# Patient Record
Sex: Female | Born: 1960 | Race: White | Hispanic: No | Marital: Married | Smoking: Never smoker
Health system: Southern US, Community
[De-identification: ages and names within clinical notes are randomized; demographics above are authoritative.]

## PROBLEM LIST (undated history)

## (undated) DIAGNOSIS — R002 Palpitations: Secondary | ICD-10-CM

## (undated) DIAGNOSIS — I341 Nonrheumatic mitral (valve) prolapse: Secondary | ICD-10-CM

## (undated) DIAGNOSIS — R0602 Shortness of breath: Secondary | ICD-10-CM

## (undated) DIAGNOSIS — E611 Iron deficiency: Secondary | ICD-10-CM

## (undated) DIAGNOSIS — D649 Anemia, unspecified: Secondary | ICD-10-CM

## (undated) HISTORY — DX: Palpitations: R00.2

## (undated) HISTORY — PX: ABDOMINAL HYSTERECTOMY: SHX81

## (undated) HISTORY — DX: Nonrheumatic mitral (valve) prolapse: I34.1

## (undated) HISTORY — PX: EXTERNAL EAR SURGERY: SHX627

---

## 1998-09-19 ENCOUNTER — Ambulatory Visit (HOSPITAL_COMMUNITY): Admission: RE | Admit: 1998-09-19 | Discharge: 1998-09-19 | Payer: Self-pay | Admitting: Obstetrics and Gynecology

## 2000-01-04 ENCOUNTER — Other Ambulatory Visit: Admission: RE | Admit: 2000-01-04 | Discharge: 2000-01-04 | Payer: Self-pay | Admitting: *Deleted

## 2002-02-08 ENCOUNTER — Other Ambulatory Visit: Admission: RE | Admit: 2002-02-08 | Discharge: 2002-02-08 | Payer: Self-pay | Admitting: Family Medicine

## 2004-10-06 ENCOUNTER — Other Ambulatory Visit: Admission: RE | Admit: 2004-10-06 | Discharge: 2004-10-06 | Payer: Self-pay | Admitting: Family Medicine

## 2006-01-17 ENCOUNTER — Other Ambulatory Visit: Admission: RE | Admit: 2006-01-17 | Discharge: 2006-01-17 | Payer: Self-pay | Admitting: Family Medicine

## 2007-03-27 ENCOUNTER — Other Ambulatory Visit: Admission: RE | Admit: 2007-03-27 | Discharge: 2007-03-27 | Payer: Self-pay | Admitting: Family Medicine

## 2008-01-12 ENCOUNTER — Encounter: Payer: Self-pay | Admitting: Cardiovascular Disease

## 2008-12-14 ENCOUNTER — Emergency Department (HOSPITAL_COMMUNITY): Admission: EM | Admit: 2008-12-14 | Discharge: 2008-12-14 | Payer: Self-pay | Admitting: Family Medicine

## 2009-02-24 ENCOUNTER — Other Ambulatory Visit: Admission: RE | Admit: 2009-02-24 | Discharge: 2009-02-24 | Payer: Self-pay | Admitting: Family Medicine

## 2010-08-28 ENCOUNTER — Encounter: Payer: Self-pay | Admitting: Cardiovascular Disease

## 2011-01-17 ENCOUNTER — Encounter: Payer: Self-pay | Admitting: Family Medicine

## 2011-08-16 ENCOUNTER — Other Ambulatory Visit: Payer: Self-pay | Admitting: Physician Assistant

## 2011-08-16 ENCOUNTER — Other Ambulatory Visit (HOSPITAL_COMMUNITY)
Admission: RE | Admit: 2011-08-16 | Discharge: 2011-08-16 | Disposition: A | Discharge status: Home / Self Care | Payer: 59 | Source: Ambulatory Visit | Attending: Family Medicine | Admitting: Family Medicine

## 2011-08-16 DIAGNOSIS — Z0000 Encounter for general adult medical examination without abnormal findings: Secondary | ICD-10-CM | POA: Insufficient documentation

## 2012-09-26 DIAGNOSIS — D649 Anemia, unspecified: Secondary | ICD-10-CM

## 2012-09-26 HISTORY — DX: Anemia, unspecified: D64.9

## 2012-10-30 ENCOUNTER — Other Ambulatory Visit: Payer: Self-pay | Admitting: Obstetrics and Gynecology

## 2012-11-13 NOTE — H&P (Signed)
FALON KENDAL  DICTATION # [id number] CSN# [id number]   Meriel Pica, MD 11/13/2012 10:47 AM

## 2012-11-14 ENCOUNTER — Inpatient Hospital Stay (HOSPITAL_COMMUNITY)
Admission: AD | Admit: 2012-11-14 | Discharge: 2012-11-15 | Disposition: A | Discharge status: Home / Self Care | Payer: 59 | Source: Ambulatory Visit | Attending: Obstetrics and Gynecology | Admitting: Obstetrics and Gynecology

## 2012-11-14 ENCOUNTER — Encounter (HOSPITAL_COMMUNITY): Payer: Self-pay | Admitting: Pharmacist

## 2012-11-14 ENCOUNTER — Other Ambulatory Visit (HOSPITAL_COMMUNITY): Payer: Self-pay | Admitting: Obstetrics and Gynecology

## 2012-11-14 ENCOUNTER — Encounter (HOSPITAL_COMMUNITY): Payer: Self-pay

## 2012-11-14 DIAGNOSIS — N949 Unspecified condition associated with female genital organs and menstrual cycle: Secondary | ICD-10-CM | POA: Insufficient documentation

## 2012-11-14 DIAGNOSIS — N938 Other specified abnormal uterine and vaginal bleeding: Secondary | ICD-10-CM | POA: Insufficient documentation

## 2012-11-14 NOTE — MAU Note (Signed)
PT SAYS SHE HAS BEEN BLEEDING SINCE September-   HAS BEEN DX BY DR HOLLAND - WITH  ADEMO..... UTERUS LINING IS THICK- DID U/S- TAKING BC PILLS- SINCE OCT- SLOWED DOWN BLEEDING SOME- DID BX-  IN NOV. CAME BACK BENIGN-    STILL BLEEDING.   LAST Friday- CRAMPING  COMES AND GOES-  BLEEDING HAS INCREASED.   WEARS TAMPON  AND PADS.    THEN TODAY -   AT 430PM- GOT H/A-  FELT  STRANGE- TOOK MOTRIN- WITH RELIEF.   AT  7PM-  FELT GUSH AND PASSED LARGE GOLF BALL SIZE CLOT.  CALLED DR AT 10PM.    NOW  IN RM 7  NOTHING ON PAD OR PERINEUM.    SCHEDULED FOR SURGERY ON Friday AM.-  D/C .

## 2012-11-14 NOTE — H&P (Signed)
NAMEKERSTON, [name] NO.:  [id number]  MEDICAL RECORD NO.:  [id number]  LOCATION:                                 FACILITY:  PHYSICIAN:  Duke Salvia. Marcelle Overlie, M.D.DATE OF BIRTH:  11-18-1961  DATE OF ADMISSION: DATE OF DISCHARGE:                             HISTORY & PHYSICAL   CHIEF COMPLAINT:  Menorrhagia with anemia.  HISTORY OF PRESENT ILLNESS:  [name], P3 whose husband has had a vasectomy.  I had seen her years ago and more recently, October of this year with complaints of heavy irregular periods.  Also, she has a separate complaint of urinary incontinence.  Evaluation in our office demonstrated normal thyroid profile, hemoglobin 8.7, FSH of 2. Sonohysterogram was carried out, showing some thickened myometrium suspicious for possible adenomyosis, simple cyst on the right with a possible 6-mm polypoid mass with irregular endometrial tissue noted. She had initially opted for a trial of OCPs, but continued to have significant menorrhagia presents now for D and C, hysteroscopy with NovaSure endometrial ablation.  This procedure including risks related to bleeding, infection or other complications that may require open additional surgery allowing her expected recovery time review with her, which she understands and accepts.  PAST MEDICAL HISTORY:  ALLERGIES:  PENICILLIN.  OBSTETRICAL HISTORY:  Three vaginal deliveries.  CURRENT MEDICATIONS:  OCPs b.i.d.  REVIEW OF SYSTEMS:  Otherwise negative.  FAMILY HISTORY:  Significant for heart disease, kidney disease, osteoporosis, arthritis, and diabetes.  SOCIAL HISTORY:  Denies drug or tobacco use, social alcohol use.  She is married.  Dr. Neva Seat is her PCP.  Pap dated October 06, 2012, was normal.  PHYSICAL EXAMINATION:  VITAL SIGNS:  Temperature 98.2, blood pressure 120/78. HEENT:  Unremarkable. NECK:  Supple without masses. LUNGS:  Clear. CARDIOVASCULAR:  Regular rate and rhythm  without murmurs, rubs, or gallops. BREASTS:  Without masses. ABDOMEN:  Soft, flat, nontender. PELVIC:  Normal external genitalia.  Vagina and cervix clear.  Uterus; upper limit and normal size, mobile.  Adnexa negative. EXTREMITIES:  Unremarkable. NEUROLOGIC:  Unremarkable.  IMPRESSION:  Menorrhagia with anemia.  PLAN:  D and C, hysteroscopy with NovaSure endometrial ablation. Procedure and risks discussed as above.     Oseas Detty M. Marcelle Overlie, M.D.     [facility]/MEDQ  D:  11/13/2012  T:  11/14/2012  Job:  [id number]

## 2012-11-15 LAB — CBC
HCT: 34.3 % — ABNORMAL LOW (ref 36.0–46.0)
Hemoglobin: 11.4 g/dL — ABNORMAL LOW (ref 12.0–15.0)
MCH: 28.9 pg (ref 26.0–34.0)
MCHC: 33.2 g/dL (ref 30.0–36.0)
MCV: 86.8 fL (ref 78.0–100.0)
RBC: 3.95 MIL/uL (ref 3.87–5.11)

## 2012-11-15 NOTE — MAU Provider Note (Signed)
History     CSN: [id number]  Arrival date and time: 11/14/12 2329   First Provider Initiated Contact with Patient 11/15/12 0026      No chief complaint on file.  HPI Kristen Lopez is a 51 y.o. female who presents to MAU with vaginal bleeding. She was evaluated in the office last week by Dr. Marcelle Overlie and is scheduled for a D&C and ablation later this week. She is taking 3 birth control pills a day for the bleeding. Today the bleeding increased and she is using a tampon and a pad every couple hours. She has passed clots tonight. She was concerned also due to a headache that started earlier today, but she took tylenol and it went away.  OB History    Grav Para Term Preterm Abortions TAB SAB Ect Mult Living   3 3 3       3       Past Medical History  Diagnosis Date  . MVP (mitral valve prolapse)   . Palpitations     Past Surgical History  Procedure Date  . External ear surgery     Family History  Problem Relation Age of Onset  . Other Father 37    Car accident  . Alcohol abuse Mother 79  . Diabetes Mother   . Hypertension Mother   . Arthritis Mother     History  Substance Use Topics  . Smoking status: Never Smoker   . Smokeless tobacco: Not on file  . Alcohol Use: 0.6 oz/week    1 Glasses of wine per week    Allergies:  Allergies  Allergen Reactions  . Erythromycin     Prescriptions prior to admission  Medication Sig Dispense Refill  . cyanocobalamin 2000 MCG tablet Take 2,000 mcg by mouth as needed.      . ferrous sulfate 325 (65 FE) MG tablet Take 325 mg by mouth daily with breakfast.      . Vitamin D, Ergocalciferol, (DRISDOL) 50000 UNITS CAPS Take 50,000 Units by mouth every 7 (seven) days.        Review of Systems  Constitutional: Negative for fever and chills.  Eyes: Negative for blurred vision and double vision.  Respiratory: Negative for cough and wheezing.   Cardiovascular: Negative for chest pain and palpitations.  Gastrointestinal: Negative  for nausea, vomiting and abdominal pain.  Genitourinary: Negative for frequency.       Vaginal bleeding  Skin: Negative for rash.  Neurological: Positive for headaches. Negative for dizziness.  Psychiatric/Behavioral: Negative for depression. The patient is not nervous/anxious.    Physical Exam   Blood pressure 159/93, pulse 71, temperature 98 F (36.7 C), temperature source Oral, resp. rate 20, last menstrual period 08/01/2012.  Physical Exam  Nursing note and vitals reviewed. Constitutional: She is oriented to person, place, and time. She appears well-developed and well-nourished. No distress.       Elevated BP  HENT:  Head: Normocephalic and atraumatic.  Eyes: EOM are normal.  Neck: Neck supple.  Cardiovascular: Normal rate.   Respiratory: Effort normal.  GI: Soft. There is no tenderness.  Genitourinary:       External genitalia without lesions. Moderate blood vaginal vault. No CMT, no adnexal tenderness. Uterus slightly enlarged.  Musculoskeletal: Normal range of motion.  Neurological: She is alert and oriented to person, place, and time.  Skin: Skin is warm and dry.  Psychiatric: She has a normal mood and affect. Her behavior is normal. Judgment and thought content normal.  Results for orders placed during the hospital encounter of 11/14/12 (from the past 24 hour(s))  CBC     Status: Abnormal   Collection Time   11/15/12 12:01 AM      Component Value Range   WBC 8.5  4.0 - 10.5 K/uL   RBC 3.95  3.87 - 5.11 MIL/uL   Hemoglobin 11.4 (*) 12.0 - 15.0 g/dL   HCT 16.1 (*) 09.6 - 04.5 %   MCV 86.8  78.0 - 100.0 fL   MCH 28.9  26.0 - 34.0 pg   MCHC 33.2  30.0 - 36.0 g/dL   RDW 40.9  81.1 - 91.4 %   Platelets 295  150 - 400 K/uL   MAU Course  Procedures Assessment: 51 y.o. female with dysfunctional bleeding  Plan:  Discussed with Dr. Rana Snare   Continue OC's   Call Dr. Marcelle Overlie later today for follow up, return here as needed.  Ivon Roedel, RN, FNP, [facility] 11/15/2012, 12:30  AM

## 2012-11-17 ENCOUNTER — Encounter (HOSPITAL_COMMUNITY): Payer: Self-pay | Admitting: Anesthesiology

## 2012-11-17 ENCOUNTER — Encounter (HOSPITAL_COMMUNITY): Payer: Self-pay | Admitting: *Deleted

## 2012-11-17 ENCOUNTER — Encounter (HOSPITAL_COMMUNITY)
Admission: RE | Disposition: A | Discharge status: Home / Self Care | Payer: Self-pay | Source: Ambulatory Visit | Attending: Obstetrics and Gynecology

## 2012-11-17 ENCOUNTER — Ambulatory Visit (HOSPITAL_COMMUNITY): Payer: 59 | Admitting: Anesthesiology

## 2012-11-17 ENCOUNTER — Ambulatory Visit (HOSPITAL_COMMUNITY)
Admission: RE | Admit: 2012-11-17 | Discharge: 2012-11-17 | Disposition: A | Discharge status: Home / Self Care | Payer: 59 | Source: Ambulatory Visit | Attending: Obstetrics and Gynecology | Admitting: Obstetrics and Gynecology

## 2012-11-17 DIAGNOSIS — N938 Other specified abnormal uterine and vaginal bleeding: Secondary | ICD-10-CM | POA: Insufficient documentation

## 2012-11-17 DIAGNOSIS — Z01812 Encounter for preprocedural laboratory examination: Secondary | ICD-10-CM | POA: Insufficient documentation

## 2012-11-17 DIAGNOSIS — Z01818 Encounter for other preprocedural examination: Secondary | ICD-10-CM | POA: Insufficient documentation

## 2012-11-17 DIAGNOSIS — N949 Unspecified condition associated with female genital organs and menstrual cycle: Secondary | ICD-10-CM | POA: Insufficient documentation

## 2012-11-17 DIAGNOSIS — D649 Anemia, unspecified: Secondary | ICD-10-CM | POA: Insufficient documentation

## 2012-11-17 HISTORY — PX: DILITATION & CURRETTAGE/HYSTROSCOPY WITH NOVASURE ABLATION: SHX5568

## 2012-11-17 HISTORY — PX: DILITATION & CURRETTAGE/HYSTROSCOPY WITH THERMACHOICE ABLATION: SHX5569

## 2012-11-17 LAB — CBC
HCT: 36 % (ref 36.0–46.0)
MCH: 28.4 pg (ref 26.0–34.0)
MCV: 88 fL (ref 78.0–100.0)
Platelets: 356 10*3/uL (ref 150–400)
RDW: 15.3 % (ref 11.5–15.5)
WBC: 10.2 10*3/uL (ref 4.0–10.5)

## 2012-11-17 SURGERY — DILATATION & CURETTAGE/HYSTEROSCOPY WITH NOVASURE ABLATION
Anesthesia: General | Site: Uterus | Wound class: Clean Contaminated

## 2012-11-17 MED ORDER — MIDAZOLAM HCL 5 MG/5ML IJ SOLN
INTRAMUSCULAR | Status: DC | PRN
  Administered 2012-11-17: 2 mg via INTRAVENOUS

## 2012-11-17 MED ORDER — ONDANSETRON HCL 4 MG/2ML IJ SOLN
INTRAMUSCULAR | Status: AC
  Filled 2012-11-17: qty 2

## 2012-11-17 MED ORDER — KETOROLAC TROMETHAMINE 30 MG/ML IJ SOLN: 15.0000 mg | Freq: Once | INTRAMUSCULAR | Status: DC | PRN

## 2012-11-17 MED ORDER — LIDOCAINE HCL 1 % IJ SOLN
INTRAMUSCULAR | Status: DC | PRN
  Administered 2012-11-17: 10 mL

## 2012-11-17 MED ORDER — DEXAMETHASONE SODIUM PHOSPHATE 4 MG/ML IJ SOLN
INTRAMUSCULAR | Status: DC | PRN
  Administered 2012-11-17: 10 mg via INTRAVENOUS

## 2012-11-17 MED ORDER — PROPOFOL 10 MG/ML IV EMUL
INTRAVENOUS | Status: DC | PRN
  Administered 2012-11-17: 200 mg via INTRAVENOUS

## 2012-11-17 MED ORDER — LACTATED RINGERS IV SOLN
INTRAVENOUS | Status: DC
  Administered 2012-11-17: 125 mL/h via INTRAVENOUS

## 2012-11-17 MED ORDER — LACTATED RINGERS IV SOLN
INTRAVENOUS | Status: DC | PRN
  Administered 2012-11-17: 1 via INTRAUTERINE

## 2012-11-17 MED ORDER — NORETHIN-ETH ESTRAD-FE BIPHAS 1 MG-10 MCG / 10 MCG PO TABS: 1.0000 | ORAL_TABLET | Freq: Every day | ORAL | Status: DC

## 2012-11-17 MED ORDER — LIDOCAINE HCL (CARDIAC) 20 MG/ML IV SOLN
INTRAVENOUS | Status: DC | PRN
  Administered 2012-11-17: 50 mg via INTRAVENOUS

## 2012-11-17 MED ORDER — MEPERIDINE HCL 25 MG/ML IJ SOLN: 6.2500 mg | INTRAMUSCULAR | Status: DC | PRN

## 2012-11-17 MED ORDER — FENTANYL CITRATE 0.05 MG/ML IJ SOLN
INTRAMUSCULAR | Status: DC | PRN
  Administered 2012-11-17 (×2): 50 ug via INTRAVENOUS

## 2012-11-17 MED ORDER — CEFAZOLIN SODIUM-DEXTROSE 2-3 GM-% IV SOLR
2.0000 g | INTRAVENOUS | Status: AC
  Administered 2012-11-17: 2 g via INTRAVENOUS

## 2012-11-17 MED ORDER — GLYCOPYRROLATE 0.2 MG/ML IJ SOLN
INTRAMUSCULAR | Status: DC | PRN
  Administered 2012-11-17: 0.2 mg via INTRAVENOUS

## 2012-11-17 MED ORDER — KETOROLAC TROMETHAMINE 30 MG/ML IJ SOLN
INTRAMUSCULAR | Status: DC | PRN
  Administered 2012-11-17: 30 mg via INTRAVENOUS

## 2012-11-17 MED ORDER — FENTANYL CITRATE 0.05 MG/ML IJ SOLN
INTRAMUSCULAR | Status: AC
  Filled 2012-11-17: qty 2

## 2012-11-17 MED ORDER — LIDOCAINE HCL (CARDIAC) 20 MG/ML IV SOLN
INTRAVENOUS | Status: AC
  Filled 2012-11-17: qty 5

## 2012-11-17 MED ORDER — CEFAZOLIN SODIUM-DEXTROSE 2-3 GM-% IV SOLR
INTRAVENOUS | Status: AC
  Filled 2012-11-17: qty 50

## 2012-11-17 MED ORDER — HYDROCODONE-IBUPROFEN 7.5-200 MG PO TABS: 1.0000 | ORAL_TABLET | Freq: Three times a day (TID) | ORAL | Status: DC | PRN

## 2012-11-17 MED ORDER — MIDAZOLAM HCL 2 MG/2ML IJ SOLN
INTRAMUSCULAR | Status: AC
  Filled 2012-11-17: qty 2

## 2012-11-17 MED ORDER — ONDANSETRON HCL 4 MG/2ML IJ SOLN: 4.0000 mg | Freq: Once | INTRAMUSCULAR | Status: DC | PRN

## 2012-11-17 MED ORDER — PROPOFOL 10 MG/ML IV EMUL
INTRAVENOUS | Status: AC
  Filled 2012-11-17: qty 20

## 2012-11-17 MED ORDER — ONDANSETRON HCL 4 MG/2ML IJ SOLN
INTRAMUSCULAR | Status: DC | PRN
  Administered 2012-11-17: 4 mg via INTRAVENOUS

## 2012-11-17 MED ORDER — GLYCOPYRROLATE 0.2 MG/ML IJ SOLN
INTRAMUSCULAR | Status: AC
  Filled 2012-11-17: qty 1

## 2012-11-17 MED ORDER — KETOROLAC TROMETHAMINE 30 MG/ML IJ SOLN
INTRAMUSCULAR | Status: AC
  Filled 2012-11-17: qty 1

## 2012-11-17 MED ORDER — FENTANYL CITRATE 0.05 MG/ML IJ SOLN: 25.0000 ug | INTRAMUSCULAR | Status: DC | PRN

## 2012-11-17 SURGICAL SUPPLY — 11 items
ABLATOR ENDOMETRIAL BIPOLAR (ABLATOR) ×3 IMPLANT
CATH THERMACHOICE III (CATHETERS) ×1 IMPLANT
CLOTH BEACON ORANGE TIMEOUT ST (SAFETY) ×2 IMPLANT
CONTAINER PREFILL 10% NBF 60ML (FORM) ×4 IMPLANT
DRESSING TELFA 8X3 (GAUZE/BANDAGES/DRESSINGS) ×2 IMPLANT
GLOVE BIO SURGEON STRL SZ7 (GLOVE) ×4 IMPLANT
GOWN STRL REIN XL XLG (GOWN DISPOSABLE) ×6 IMPLANT
PACK HYSTEROSCOPY LF (CUSTOM PROCEDURE TRAY) ×2 IMPLANT
PAD OB MATERNITY 4.3X12.25 (PERSONAL CARE ITEMS) ×2 IMPLANT
TOWEL OR 17X24 6PK STRL BLUE (TOWEL DISPOSABLE) ×4 IMPLANT
WATER STERILE IRR 1000ML POUR (IV SOLUTION) ×2 IMPLANT

## 2012-11-17 NOTE — Op Note (Signed)
Preoperative diagnosis: Probable adenomyosis, abnormal uterine bleeding with anemia  Postoperative diagnosis: Same  Procedure: Hysteroscopy with D&C, failed NovaSure, failed ThermaChoice endometrial ablation  Surgeon: Marcelle Overlie  Anesthesia: Gen.  EBL: Less than 50 cc  Specimens removed: Endometrial curettings, to pathology  Procedure and findings:  The patient taken the operating room after an adequate level of general anesthesia was obtained the vagina was prepped and prepped and draped in usual fashion for D&C the bladder was drained he UA carried out uterus was 10 week size mobile adnexa negative. Speculum was positioned cervix was grasped grasped with a tenaculum the cervix was already adequately dilated to a 32 Pratt at least from the amount of bleeding that she's had, dilatation was not required. Paracervical block was created by infiltrating at 3 and 9:00 submucosally 5-7 cc 1% Xylocaine at each site after negative aspiration the uterus is then sounded to 9 cm, the scope was inserted a lot of shaggy endometrium and some old blood clot was noted, sharp curettage was carried out specimen was sent to pathology. The NovaSure device was inserted with a width of 4.5 the cavity depth 6.0, initially we could not get CO2 testing to pass, it was thought to perhaps be a generator problem, second generator was positioned still could not get the CO2 testing the past. That point I cannot discern any trouble leaking from the cervix so we tried a second NovaSure device at this time even with Bilton seal there was some bubbling of CO2 from her over dilated cervix, posterior tenaculum was positioned along with Vaseline gauze did not get the CO2 testing in the past I think the tube a leakage at the over dilated cervical os. This point decision made to switch to ThermaChoice EMA. During the change, the uterus was rescan her to 9 cm, no evidence of any perforation. ThermaChoice balloon was then inserted to the normal  depth, however we could not maintain pressure above 150 to allow the procedure to take place. Attempts at ablation were aborted at that point, and stress removed. She went to recovery room in good condition.  Dictated with [facility]  Ebon Ketchum M. Milana Obey.D.

## 2012-11-17 NOTE — Anesthesia Postprocedure Evaluation (Signed)
  Anesthesia Post-op Note  Patient: Kristen Lopez  Procedure(s) Performed: Procedure(s) (LRB) with comments: DILATATION & CURETTAGE/HYSTEROSCOPY WITH NOVASURE ABLATION (N/A) - Attempted and failed DILATATION & CURETTAGE/HYSTEROSCOPY WITH THERMACHOICE ABLATION (N/A)  Patient Location: PACU  Anesthesia Type:General  Level of Consciousness: awake, alert  and oriented  Airway and Oxygen Therapy: Patient Spontanous Breathing  Post-op Pain: none  Post-op Assessment: Post-op Vital signs reviewed, Patient's Cardiovascular Status Stable, Respiratory Function Stable, Patent Airway, No signs of Nausea or vomiting and Pain level controlled  Post-op Vital Signs: Reviewed and stable  Complications: No apparent anesthesia complications

## 2012-11-17 NOTE — Anesthesia Preprocedure Evaluation (Signed)
Anesthesia Evaluation  Patient identified by MRN, date of birth, ID band Patient awake    Reviewed: Allergy & Precautions, H&P , NPO status , Patient's Chart, lab work & pertinent test results  Airway Mallampati: I TM Distance: >3 FB Neck ROM: full    Dental No notable dental hx. (+) Teeth Intact   Pulmonary neg pulmonary ROS,    Pulmonary exam normal       Cardiovascular negative cardio ROS      Neuro/Psych negative neurological ROS  negative psych ROS   GI/Hepatic negative GI ROS, Neg liver ROS,   Endo/Other  negative endocrine ROS  Renal/GU negative Renal ROS  negative genitourinary   Musculoskeletal negative musculoskeletal ROS (+)   Abdominal Normal abdominal exam  (+)   Peds negative pediatric ROS (+)  Hematology negative hematology ROS (+)   Anesthesia Other Findings   Reproductive/Obstetrics negative OB ROS                           Anesthesia Physical Anesthesia Plan  ASA: II  Anesthesia Plan: General   Post-op Pain Management:    Induction: Intravenous  Airway Management Planned: LMA  Additional Equipment:   Intra-op Plan:   Post-operative Plan:   Informed Consent: I have reviewed the patients History and Physical, chart, labs and discussed the procedure including the risks, benefits and alternatives for the proposed anesthesia with the patient or authorized representative who has indicated his/her understanding and acceptance.     Plan Discussed with: CRNA and Surgeon  Anesthesia Plan Comments:         Anesthesia Quick Evaluation  

## 2012-11-17 NOTE — Progress Notes (Signed)
The patient was re-examined with no change in status 

## 2012-11-17 NOTE — Transfer of Care (Signed)
Immediate Anesthesia Transfer of Care Note  Patient: Kristen Lopez  Procedure(s) Performed: Procedure(s) (LRB) with comments: DILATATION & CURETTAGE/HYSTEROSCOPY WITH NOVASURE ABLATION (N/A) - Attempted and failed DILATATION & CURETTAGE/HYSTEROSCOPY WITH THERMACHOICE ABLATION (N/A)  Patient Location: PACU  Anesthesia Type:General  Level of Consciousness: awake, alert  and oriented  Airway & Oxygen Therapy: Patient Spontanous Breathing and Patient connected to nasal cannula oxygen  Post-op Assessment: Report given to PACU RN and Post -op Vital signs reviewed and stable  Post vital signs: Reviewed and stable  Complications: No apparent anesthesia complications

## 2012-11-20 ENCOUNTER — Encounter (HOSPITAL_COMMUNITY): Payer: Self-pay | Admitting: Obstetrics and Gynecology

## 2012-11-20 NOTE — H&P (Signed)
CAMIRA SHIRAR  DICTATION # [id number] CSN# [id number]   Meriel Pica, MD 11/20/2012 9:38 AM

## 2012-11-21 ENCOUNTER — Encounter (HOSPITAL_COMMUNITY): Payer: Self-pay

## 2012-11-21 ENCOUNTER — Encounter (HOSPITAL_COMMUNITY)
Admission: RE | Admit: 2012-11-21 | Discharge: 2012-11-21 | Disposition: A | Discharge status: Home / Self Care | Payer: 59 | Source: Ambulatory Visit | Attending: Obstetrics and Gynecology | Admitting: Obstetrics and Gynecology

## 2012-11-21 HISTORY — DX: Shortness of breath: R06.02

## 2012-11-21 HISTORY — DX: Anemia, unspecified: D64.9

## 2012-11-21 LAB — CBC
HCT: 36.5 % (ref 36.0–46.0)
Hemoglobin: 12.1 g/dL (ref 12.0–15.0)
RBC: 4.15 MIL/uL (ref 3.87–5.11)

## 2012-11-21 LAB — SURGICAL PCR SCREEN
MRSA, PCR: NEGATIVE
Staphylococcus aureus: NEGATIVE

## 2012-11-21 NOTE — Patient Instructions (Addendum)
Your procedure is scheduled on:11/22/12  Enter through the Main Entrance at :6am Pick up desk phone and dial [phone] and inform us of your arrival.  Please call [phone] if you have any problems the morning of surgery.  Remember: Do not eat or drink after midnight:tonight   Take these meds the morning of surgery with a sip of water:none DO NOT wear jewelry, eye make-up, lipstick,body lotion, or dark fingernail polish. Do not shave today.  If you are to be admitted after surgery, leave suitcase in car until your room has been assigned.

## 2012-11-21 NOTE — H&P (Signed)
Kristen Lopez, Kristen NO.:  Lopez  MEDICAL RECORD NO.:  [id number]  LOCATION:                                 FACILITY:  PHYSICIAN:  Duke Salvia. Marcelle Overlie, M.D.DATE OF BIRTH:  04/14/1961  DATE OF ADMISSION: DATE OF DISCHARGE:                             HISTORY & PHYSICAL   CHIEF COMPLAINT:  Menorrhagia.  HISTORY OF PRESENT ILLNESS:  A 51 year old, G3, P3, whose husband has had a vasectomy.  She presented in the last 6 months with complaints of anemia with menorrhagia.  Endometrial biopsy was performed in the office on November 08, 2012, that showed atrophic-appearing endometrium.  No evidence of hyperplasia.  FHT was performed on November 08, 2012, that showed thickened myometrium 4.5 and 2.3 cm consistent with adenomyosis. Adnexa unremarkable.  A polypoid mass noted on saline infusion.  She was treated with OCPs either b.i.d. or t.i.d. to regulate her bleeding, and was started on iron and presented for outpatient D and C, hysteroscopy with ablation.  At the time of ablation, the cavity was too large for the NovaSure to work, when we tried the ThermaChoice, we could never achieved consistent pressure with insufflation of the balloon.  This was a failed endometrial ablation.  She presents now for LAVH.  This procedure including risks related to bleeding, infection, transfusion adjacent to organ injury, the possible need to complete the surgery by open technique along with her expected recovery time reviewed, which she understands and accepts.  Additionally, recent [facility] was 2.0, vitamin D was normal and thyroid profile was normal.  PAST MEDICAL HISTORY:  ALLERGIES:  PENICILLIN, which causes nausea.  She has had three vaginal deliveries in the past, currently on OCPs and iron.  REVIEW OF SYSTEMS:  Otherwise negative.  FAMILY HISTORY:  Significant for history of heart disease, kidney disease, osteoporosis, arthritis, and diabetes.  SOCIAL HISTORY:   Denies tobacco or drug use.  She does drink socially. She is married.  Dr. Lucrezia Europe is her PCP.  PHYSICAL EXAMINATION:  VITAL SIGNS:  Temp 98.2, blood pressure 120/78. HEENT:  Unremarkable. NECK:  Supple without masses. LUNGS:  Clear. CARDIOVASCULAR:  Regular rate and rhythm without murmurs, rubs, or gallops. BREASTS:  Without masses. ABDOMEN:  Soft, flat, nontender. PELVIC:  Normal external genitalia.  Vagina and cervix are clear. Uterus is 9-10 weeks size, mid-position, mobile.  Adnexa negative. EXTREMITIES:  Unremarkable. NEUROLOGIC:  Unremarkable.  IMPRESSION: 1. Menorrhagia with anemia. 2. Adenomyosis.  PLAN:  LAVH.  Procedure and risks discussed as above.     Ezell Poke M. Marcelle Overlie, M.D.     [facility]/MEDQ  D:  11/20/2012  T:  11/20/2012  Job:  [id number]

## 2012-11-22 ENCOUNTER — Encounter (HOSPITAL_COMMUNITY): Payer: Self-pay | Admitting: Anesthesiology

## 2012-11-22 ENCOUNTER — Observation Stay (HOSPITAL_COMMUNITY)
Admission: RE | Admit: 2012-11-22 | Discharge: 2012-11-23 | Disposition: A | Discharge status: Home / Self Care | Payer: 59 | Source: Ambulatory Visit | Attending: Obstetrics and Gynecology | Admitting: Obstetrics and Gynecology

## 2012-11-22 ENCOUNTER — Encounter (HOSPITAL_COMMUNITY)
Admission: RE | Disposition: A | Discharge status: Home / Self Care | Payer: Self-pay | Source: Ambulatory Visit | Attending: Obstetrics and Gynecology

## 2012-11-22 ENCOUNTER — Encounter (HOSPITAL_COMMUNITY): Payer: Self-pay

## 2012-11-22 ENCOUNTER — Ambulatory Visit (HOSPITAL_COMMUNITY): Payer: 59 | Admitting: Anesthesiology

## 2012-11-22 DIAGNOSIS — D649 Anemia, unspecified: Secondary | ICD-10-CM | POA: Insufficient documentation

## 2012-11-22 DIAGNOSIS — N920 Excessive and frequent menstruation with regular cycle: Principal | ICD-10-CM | POA: Insufficient documentation

## 2012-11-22 DIAGNOSIS — N8000 Endometriosis of the uterus, unspecified: Secondary | ICD-10-CM | POA: Insufficient documentation

## 2012-11-22 DIAGNOSIS — N841 Polyp of cervix uteri: Secondary | ICD-10-CM | POA: Insufficient documentation

## 2012-11-22 HISTORY — PX: LAPAROSCOPIC ASSISTED VAGINAL HYSTERECTOMY: SHX5398

## 2012-11-22 SURGERY — HYSTERECTOMY, VAGINAL, LAPAROSCOPY-ASSISTED
Anesthesia: General | Site: Abdomen | Wound class: Clean Contaminated

## 2012-11-22 MED ORDER — FENTANYL CITRATE 0.05 MG/ML IJ SOLN
INTRAMUSCULAR | Status: DC | PRN
  Administered 2012-11-22: 150 ug via INTRAVENOUS
  Administered 2012-11-22: 100 ug via INTRAVENOUS

## 2012-11-22 MED ORDER — GLYCOPYRROLATE 0.2 MG/ML IJ SOLN
INTRAMUSCULAR | Status: DC | PRN
  Administered 2012-11-22: 0.4 mg via INTRAVENOUS

## 2012-11-22 MED ORDER — DIPHENHYDRAMINE HCL 50 MG/ML IJ SOLN: 12.5000 mg | Freq: Four times a day (QID) | INTRAMUSCULAR | Status: DC | PRN

## 2012-11-22 MED ORDER — MIDAZOLAM HCL 5 MG/5ML IJ SOLN
INTRAMUSCULAR | Status: DC | PRN
  Administered 2012-11-22: 2 mg via INTRAVENOUS

## 2012-11-22 MED ORDER — SCOPOLAMINE 1 MG/3DAYS TD PT72
MEDICATED_PATCH | TRANSDERMAL | Status: AC
  Filled 2012-11-22: qty 1

## 2012-11-22 MED ORDER — GENTAMICIN SULFATE 40 MG/ML IJ SOLN
INTRAMUSCULAR | Status: AC
  Administered 2012-11-22: 100 mL via INTRAVENOUS
  Filled 2012-11-22: qty 7.69

## 2012-11-22 MED ORDER — LIDOCAINE HCL (CARDIAC) 20 MG/ML IV SOLN
INTRAVENOUS | Status: AC
  Filled 2012-11-22: qty 5

## 2012-11-22 MED ORDER — GLYCOPYRROLATE 0.2 MG/ML IJ SOLN
INTRAMUSCULAR | Status: AC
  Filled 2012-11-22: qty 2

## 2012-11-22 MED ORDER — DEXAMETHASONE SODIUM PHOSPHATE 10 MG/ML IJ SOLN
INTRAMUSCULAR | Status: AC
  Filled 2012-11-22: qty 1

## 2012-11-22 MED ORDER — SCOPOLAMINE 1 MG/3DAYS TD PT72
1.0000 | MEDICATED_PATCH | TRANSDERMAL | Status: DC
  Administered 2012-11-22: 1.5 mg via TRANSDERMAL

## 2012-11-22 MED ORDER — ONDANSETRON HCL 4 MG/2ML IJ SOLN
INTRAMUSCULAR | Status: AC
  Filled 2012-11-22: qty 2

## 2012-11-22 MED ORDER — FENTANYL CITRATE 0.05 MG/ML IJ SOLN
INTRAMUSCULAR | Status: AC
  Filled 2012-11-22: qty 5

## 2012-11-22 MED ORDER — MORPHINE SULFATE (PF) 1 MG/ML IV SOLN
INTRAVENOUS | Status: DC
  Administered 2012-11-22: 13 mg via INTRAVENOUS
  Administered 2012-11-22: 10:00:00 via INTRAVENOUS
  Administered 2012-11-22: 4 mg via INTRAVENOUS
  Administered 2012-11-22: 9 mg via INTRAVENOUS
  Administered 2012-11-23: 1 mg via INTRAVENOUS
  Filled 2012-11-22: qty 25

## 2012-11-22 MED ORDER — NEOSTIGMINE METHYLSULFATE 1 MG/ML IJ SOLN
INTRAMUSCULAR | Status: AC
  Filled 2012-11-22: qty 10

## 2012-11-22 MED ORDER — ONDANSETRON HCL 4 MG/2ML IJ SOLN
INTRAMUSCULAR | Status: DC | PRN
  Administered 2012-11-22: 4 mg via INTRAVENOUS

## 2012-11-22 MED ORDER — ROCURONIUM BROMIDE 100 MG/10ML IV SOLN
INTRAVENOUS | Status: DC | PRN
  Administered 2012-11-22: 40 mg via INTRAVENOUS

## 2012-11-22 MED ORDER — ONDANSETRON HCL 4 MG PO TABS: 4.0000 mg | ORAL_TABLET | Freq: Four times a day (QID) | ORAL | Status: DC | PRN

## 2012-11-22 MED ORDER — LIDOCAINE HCL (CARDIAC) 20 MG/ML IV SOLN
INTRAVENOUS | Status: DC | PRN
  Administered 2012-11-22: 50 mg via INTRAVENOUS

## 2012-11-22 MED ORDER — IBUPROFEN 800 MG PO TABS: 800.0000 mg | ORAL_TABLET | Freq: Three times a day (TID) | ORAL | Status: DC | PRN

## 2012-11-22 MED ORDER — FENTANYL CITRATE 0.05 MG/ML IJ SOLN
INTRAMUSCULAR | Status: AC
  Administered 2012-11-22: 50 ug via INTRAVENOUS
  Filled 2012-11-22: qty 2

## 2012-11-22 MED ORDER — HYDROMORPHONE HCL PF 1 MG/ML IJ SOLN
INTRAMUSCULAR | Status: DC | PRN
  Administered 2012-11-22: 1 mg via INTRAVENOUS

## 2012-11-22 MED ORDER — LACTATED RINGERS IV SOLN
INTRAVENOUS | Status: DC
  Administered 2012-11-22 (×2): via INTRAVENOUS

## 2012-11-22 MED ORDER — BUPIVACAINE HCL (PF) 0.25 % IJ SOLN
INTRAMUSCULAR | Status: DC | PRN
  Administered 2012-11-22: 6 mL

## 2012-11-22 MED ORDER — SODIUM CHLORIDE 0.9 % IJ SOLN: 9.0000 mL | INTRAMUSCULAR | Status: DC | PRN

## 2012-11-22 MED ORDER — NALOXONE HCL 0.4 MG/ML IJ SOLN: 0.4000 mg | INTRAMUSCULAR | Status: DC | PRN

## 2012-11-22 MED ORDER — ONDANSETRON HCL 4 MG/2ML IJ SOLN: 4.0000 mg | Freq: Four times a day (QID) | INTRAMUSCULAR | Status: DC | PRN

## 2012-11-22 MED ORDER — MENTHOL 3 MG MT LOZG
1.0000 | LOZENGE | OROMUCOSAL | Status: DC | PRN
  Administered 2012-11-22: 3 mg via ORAL
  Filled 2012-11-22: qty 9

## 2012-11-22 MED ORDER — OXYCODONE-ACETAMINOPHEN 5-325 MG PO TABS: 1.0000 | ORAL_TABLET | ORAL | Status: DC | PRN

## 2012-11-22 MED ORDER — NEOSTIGMINE METHYLSULFATE 1 MG/ML IJ SOLN
INTRAMUSCULAR | Status: DC | PRN
  Administered 2012-11-22: 2 mg via INTRAVENOUS

## 2012-11-22 MED ORDER — PROMETHAZINE HCL 25 MG/ML IJ SOLN: 6.2500 mg | INTRAMUSCULAR | Status: DC | PRN

## 2012-11-22 MED ORDER — MIDAZOLAM HCL 2 MG/2ML IJ SOLN: 0.5000 mg | Freq: Once | INTRAMUSCULAR | Status: DC | PRN

## 2012-11-22 MED ORDER — ROCURONIUM BROMIDE 50 MG/5ML IV SOLN
INTRAVENOUS | Status: AC
  Filled 2012-11-22: qty 1

## 2012-11-22 MED ORDER — KETOROLAC TROMETHAMINE 30 MG/ML IJ SOLN: 30.0000 mg | Freq: Once | INTRAMUSCULAR | Status: DC

## 2012-11-22 MED ORDER — DEXTROSE IN LACTATED RINGERS 5 % IV SOLN
INTRAVENOUS | Status: DC
  Administered 2012-11-22 – 2012-11-23 (×2): via INTRAVENOUS

## 2012-11-22 MED ORDER — KETOROLAC TROMETHAMINE 30 MG/ML IJ SOLN: 15.0000 mg | Freq: Once | INTRAMUSCULAR | Status: DC | PRN

## 2012-11-22 MED ORDER — MIDAZOLAM HCL 2 MG/2ML IJ SOLN
INTRAMUSCULAR | Status: AC
  Filled 2012-11-22: qty 2

## 2012-11-22 MED ORDER — LACTATED RINGERS IR SOLN
Status: DC | PRN
  Administered 2012-11-22: 3000 mL

## 2012-11-22 MED ORDER — KETOROLAC TROMETHAMINE 30 MG/ML IJ SOLN
INTRAMUSCULAR | Status: AC
  Filled 2012-11-22: qty 1

## 2012-11-22 MED ORDER — SODIUM CHLORIDE 0.9 % IJ SOLN
INTRAMUSCULAR | Status: DC | PRN
  Administered 2012-11-22: 10 mL

## 2012-11-22 MED ORDER — FENTANYL CITRATE 0.05 MG/ML IJ SOLN
25.0000 ug | INTRAMUSCULAR | Status: DC | PRN
  Administered 2012-11-22 (×2): 50 ug via INTRAVENOUS

## 2012-11-22 MED ORDER — ZOLPIDEM TARTRATE 5 MG PO TABS: 5.0000 mg | ORAL_TABLET | Freq: Every evening | ORAL | Status: DC | PRN

## 2012-11-22 MED ORDER — HYDROMORPHONE HCL PF 1 MG/ML IJ SOLN
INTRAMUSCULAR | Status: AC
  Filled 2012-11-22: qty 1

## 2012-11-22 MED ORDER — KETOROLAC TROMETHAMINE 30 MG/ML IJ SOLN
INTRAMUSCULAR | Status: DC | PRN
  Administered 2012-11-22: 30 mg via INTRAVENOUS

## 2012-11-22 MED ORDER — PROPOFOL 10 MG/ML IV EMUL
INTRAVENOUS | Status: DC | PRN
  Administered 2012-11-22: 200 mg via INTRAVENOUS

## 2012-11-22 MED ORDER — DIPHENHYDRAMINE HCL 12.5 MG/5ML PO ELIX: 12.5000 mg | ORAL_SOLUTION | Freq: Four times a day (QID) | ORAL | Status: DC | PRN

## 2012-11-22 MED ORDER — BUTORPHANOL TARTRATE 1 MG/ML IJ SOLN: 1.0000 mg | INTRAMUSCULAR | Status: DC | PRN

## 2012-11-22 MED ORDER — KETOROLAC TROMETHAMINE 30 MG/ML IJ SOLN
30.0000 mg | Freq: Four times a day (QID) | INTRAMUSCULAR | Status: DC
  Administered 2012-11-22 – 2012-11-23 (×3): 30 mg via INTRAVENOUS
  Filled 2012-11-22 (×2): qty 1

## 2012-11-22 MED ORDER — BUPIVACAINE HCL (PF) 0.25 % IJ SOLN
INTRAMUSCULAR | Status: AC
  Filled 2012-11-22: qty 30

## 2012-11-22 MED ORDER — MEPERIDINE HCL 25 MG/ML IJ SOLN: 6.2500 mg | INTRAMUSCULAR | Status: DC | PRN

## 2012-11-22 MED ORDER — PROPOFOL 10 MG/ML IV EMUL
INTRAVENOUS | Status: AC
  Filled 2012-11-22: qty 20

## 2012-11-22 MED ORDER — KETOROLAC TROMETHAMINE 30 MG/ML IJ SOLN
30.0000 mg | Freq: Four times a day (QID) | INTRAMUSCULAR | Status: DC
  Filled 2012-11-22: qty 1

## 2012-11-22 MED ORDER — DEXAMETHASONE SODIUM PHOSPHATE 4 MG/ML IJ SOLN
INTRAMUSCULAR | Status: DC | PRN
  Administered 2012-11-22: 10 mg via INTRAVENOUS

## 2012-11-22 SURGICAL SUPPLY — 32 items
ADH SKN CLS APL DERMABOND .7 (GAUZE/BANDAGES/DRESSINGS) ×1
CATH ROBINSON RED A/P 16FR (CATHETERS) ×1 IMPLANT
CLOTH BEACON ORANGE TIMEOUT ST (SAFETY) ×2 IMPLANT
CONT PATH 16OZ SNAP LID 3702 (MISCELLANEOUS) ×2 IMPLANT
COVER TABLE BACK 60X90 (DRAPES) ×2 IMPLANT
DERMABOND ADVANCED (GAUZE/BANDAGES/DRESSINGS) ×1
DERMABOND ADVANCED .7 DNX12 (GAUZE/BANDAGES/DRESSINGS) ×2 IMPLANT
ELECT LIGASURE LONG (ELECTRODE) ×2 IMPLANT
ELECT REM PT RETURN 9FT ADLT (ELECTROSURGICAL) ×2
ELECTRODE REM PT RTRN 9FT ADLT (ELECTROSURGICAL) ×1 IMPLANT
GLOVE BIO SURGEON STRL SZ7 (GLOVE) ×6 IMPLANT
GLOVE BIOGEL PI IND STRL 6.5 (GLOVE) ×1 IMPLANT
GLOVE BIOGEL PI INDICATOR 6.5 (GLOVE) ×1
GOWN STRL REIN XL XLG (GOWN DISPOSABLE) ×8 IMPLANT
NDL INSUFFLATION 14GA 120MM (NEEDLE) ×1 IMPLANT
NEEDLE INSUFFLATION 14GA 120MM (NEEDLE) ×2 IMPLANT
NS IRRIG 1000ML POUR BTL (IV SOLUTION) ×2 IMPLANT
PACK LAVH (CUSTOM PROCEDURE TRAY) ×2 IMPLANT
PROTECTOR NERVE ULNAR (MISCELLANEOUS) ×2 IMPLANT
SEALER TISSUE G2 CVD JAW 45CM (ENDOMECHANICALS) ×2 IMPLANT
SET IRRIG TUBING LAPAROSCOPIC (IRRIGATION / IRRIGATOR) ×1 IMPLANT
SUT MON AB 2-0 CT1 36 (SUTURE) ×4 IMPLANT
SUT VIC AB 0 CT1 18XCR BRD8 (SUTURE) ×3 IMPLANT
SUT VIC AB 0 CT1 8-18 (SUTURE) ×6
SUT VICRYL 0 TIES 12 18 (SUTURE) ×2 IMPLANT
SUT VICRYL 4-0 PS2 18IN ABS (SUTURE) ×2 IMPLANT
TOWEL OR 17X24 6PK STRL BLUE (TOWEL DISPOSABLE) ×4 IMPLANT
TRAY FOLEY CATH 14FR (SET/KITS/TRAYS/PACK) ×2 IMPLANT
TROCAR Z-THREAD BLADED 11X100M (TROCAR) ×2 IMPLANT
TROCAR Z-THREAD BLADED 5X100MM (TROCAR) ×3 IMPLANT
WARMER LAPAROSCOPE (MISCELLANEOUS) ×2 IMPLANT
WATER STERILE IRR 1000ML POUR (IV SOLUTION) ×2 IMPLANT

## 2012-11-22 NOTE — Anesthesia Postprocedure Evaluation (Signed)
  Anesthesia Post-op Note  Patient: Kristen Lopez  Procedure(s) Performed: Procedure(s) (LRB) with comments: LAPAROSCOPIC ASSISTED VAGINAL HYSTERECTOMY (N/A)  Patient Location: Women[facility] Unit  Anesthesia Type:General  Level of Consciousness: awake, alert  and oriented  Airway and Oxygen Therapy: Patient Spontanous Breathing and Patient connected to nasal cannula oxygen  Post-op Pain: none  Post-op Assessment: Post-op Vital signs reviewed and Patient's Cardiovascular Status Stable  Post-op Vital Signs: Reviewed and stable  Complications: No apparent anesthesia complications

## 2012-11-22 NOTE — Progress Notes (Signed)
The patient was re-examined with no change in status 

## 2012-11-22 NOTE — Transfer of Care (Signed)
Immediate Anesthesia Transfer of Care Note  Patient: Kristen Lopez  Procedure(s) Performed: Procedure(s) (LRB) with comments: LAPAROSCOPIC ASSISTED VAGINAL HYSTERECTOMY (N/A)  Patient Location: PACU  Anesthesia Type:General  Level of Consciousness: awake, alert  and oriented  Airway & Oxygen Therapy: Patient Spontanous Breathing and Patient connected to nasal cannula oxygen  Post-op Assessment: Report given to PACU RN and Post -op Vital signs reviewed and stable  Post vital signs: Reviewed and stable  Complications: No apparent anesthesia complications

## 2012-11-22 NOTE — Anesthesia Preprocedure Evaluation (Signed)
Anesthesia Evaluation  Patient identified by MRN, date of birth, ID band Patient awake    Reviewed: Allergy & Precautions, H&P , Patient's Chart, lab work & pertinent test results, reviewed documented beta blocker date and time   History of Anesthesia Complications Negative for: history of anesthetic complications  Airway Mallampati: II TM Distance: >3 FB Neck ROM: full    Dental No notable dental hx.    Pulmonary neg pulmonary ROS, shortness of breath,  breath sounds clear to auscultation  Pulmonary exam normal       Cardiovascular Exercise Tolerance: Good negative cardio ROS  + Valvular Problems/Murmurs MVP Rhythm:regular Rate:Normal     Neuro/Psych negative neurological ROS  negative psych ROS   GI/Hepatic negative GI ROS, Neg liver ROS,   Endo/Other  negative endocrine ROS  Renal/GU negative Renal ROS     Musculoskeletal   Abdominal   Peds  Hematology negative hematology ROS (+)   Anesthesia Other Findings MVP (mitral valve prolapse)     Palpitations   1980    Anemia 09/2012   Shortness of breath   with anemia       Reproductive/Obstetrics negative OB ROS                           Anesthesia Physical Anesthesia Plan  ASA: II  Anesthesia Plan: General ETT   Post-op Pain Management:    Induction:   Airway Management Planned:   Additional Equipment:   Intra-op Plan:   Post-operative Plan:   Informed Consent: I have reviewed the patients History and Physical, chart, labs and discussed the procedure including the risks, benefits and alternatives for the proposed anesthesia with the patient or authorized representative who has indicated his/her understanding and acceptance.   Dental Advisory Given  Plan Discussed with: CRNA and Surgeon  Anesthesia Plan Comments:         Anesthesia Quick Evaluation

## 2012-11-22 NOTE — Anesthesia Postprocedure Evaluation (Signed)
Anesthesia Post Note  Patient: Kristen Lopez  Procedure(s) Performed: Procedure(s) (LRB): LAPAROSCOPIC ASSISTED VAGINAL HYSTERECTOMY (N/A)  Anesthesia type: General  Patient location: PACU  Post pain: Pain level controlled  Post assessment: Post-op Vital signs reviewed  Last Vitals:  Filed Vitals:   11/22/12 0915  BP: 136/86  Pulse: 66  Temp:   Resp: 15    Post vital signs: Reviewed  Level of consciousness: sedated  Complications: No apparent anesthesia complicationsfj

## 2012-11-22 NOTE — Addendum Note (Signed)
Addendum  created 11/22/12 1759 by Shanon Payor, CRNA   Modules edited:Notes Section

## 2012-11-22 NOTE — Op Note (Signed)
Preoperative diagnosis: Menorrhagia with anemia, adenomyosis  Postoperative diagnosis: Same  Procedure: LAVH  Surgeon: Marcelle Overlie  Assistant: Renaldo Fiddler  EBL: 200 cc  Specimens removed: Uterus, to pathology  Complications: None  Procedure and findings:  The patient taken the operating room after an adequate level of general anesthesia, appropriate timeout for taken. The abdomen perineum and vagina were prepped and draped in usual fashion for vaginal procedures. The bladder was drained he UA carried out uterus was 9 weeks size mobile adnexa negative. Hulka tenaculum was positioned. Attention directed to the abdomen the subumbilical area was infiltrated with quarter percent Marcaine plain small incision was made in the varies needle was introduced without difficulty. Its intra-abdominal position was verified by pressure water testing. After 2-1/2 L pneumoperitoneum syncopated lap scopic trocar and sleeve were then introduced that difficulty. The patient in Trendelenburg, 3 finger breaths above the symphysis in the midline a 5 mm trocar was inserted under direct visualization. The pelvic findings as follows  Uterus itself was symmetrically enlarged, globular at the fundus consistent with adenomyosis 9-10 week size. Adnexa unremarkable. Starting on the right the utero-ovarian pedicle was coagulated and divided with the in seal device down to and including the round ligament with excellent hemostasis. The exact same was repeated on the opposite side, thus conserving both ovaries. After this was completed the vaginal portion the procedure was started  The legs were extended weighted speculum was positioned cervix grasped with tenaculum the cervical vaginal mucosa was incised posterior culdotomy performed without difficulty. Bladder was advanced superiorly with sharp and blunt dissection until the a tear peritoneal reflection could be identified, this was entered sharply and a retractor was then used to  gently elevate the bladder out of the field. Using the LigaSure device the uterosacral ligament, cardinal ligament uterine vasculature pedicles and upper broad ligament pedicles were coagulated and divided staying close to the uterus. These areas were hemostatic cannot deliver the fundus at that point it to its size, the lower uterus is then morcellated until the fundus could be delivered remaining pedicles were clamped divided the remainder of the uterus was removed sent to pathology these pedicles were free tie with 0 Vicryl ties. Vaginal cuff was then run from 3 to 9:00 with a running locked 2-0 Vicryl suture. Prior to closure sponge denies precast reported as correct x2 vaginal mucosa was then closed right to left with interrupted 2-0 Monocryl sutures this area was hemostatic the bladder was drained the Foley clear urine was then noted. Repeat laparoscopy was carried out within a shot, excellent hemostasis, the pressure was reduced, the operative site was hemostatic no other abnormalities noted at final laparoscopy. Its was removed the subumbilical defect closed with a 4-0 Monocryl subcuticular with Dermabond on the lower incision she tolerated this well went to recovery in good condition.  Dictated with [facility]  Cedra Villalon M. Milana Obey.D.

## 2012-11-23 ENCOUNTER — Encounter (HOSPITAL_COMMUNITY): Payer: Self-pay | Admitting: *Deleted

## 2012-11-23 LAB — CBC
HCT: 30.4 % — ABNORMAL LOW (ref 36.0–46.0)
Hemoglobin: 9.9 g/dL — ABNORMAL LOW (ref 12.0–15.0)
MCHC: 32.6 g/dL (ref 30.0–36.0)
RDW: 15.5 % (ref 11.5–15.5)
WBC: 14.3 10*3/uL — ABNORMAL HIGH (ref 4.0–10.5)

## 2012-11-23 MED ORDER — IBUPROFEN 800 MG PO TABS: 800.0000 mg | ORAL_TABLET | Freq: Three times a day (TID) | ORAL | Status: DC | PRN

## 2012-11-23 NOTE — Progress Notes (Signed)
1 Day Post-Op Procedure(s) (LRB): LAPAROSCOPIC ASSISTED VAGINAL HYSTERECTOMY (N/A)  Subjective: Patient reports tolerating PO.    Objective: I have reviewed patient's vital signs and labs. BP 136/77  Pulse 63  Temp 97.9 F (36.6 C) (Oral)  Resp 18  Ht 5\' 4"  (1.626 m)  Wt 158 lb (71.668 kg)  BMI 27.12 kg/m2  SpO2 97%  LMP 08/01/2012 abd soft + BS, Incs c/d  Assessment: s/p Procedure(s) (LRB) with comments: LAPAROSCOPIC ASSISTED VAGINAL HYSTERECTOMY (N/A): stable  Plan: Discharge home  LOS: 1 day    Kenrick Pore M 11/23/2012, 10:30 AM

## 2012-11-23 NOTE — Discharge Summary (Signed)
Physician Discharge Summary  Patient ID: Kristen Lopez MRN: [id number] DOB/AGE: 06-14-1961 51 y.o.  Admit date: 11/22/2012 Discharge date: 11/23/2012  Admission Diagnoses:  Discharge Diagnoses:  Active Problems:  * No active hospital problems. *    Discharged Condition: good  Hospital Course: adm for LAVH, with 250 cc EBL, DC on POD #1 afeb, abd benign , tol PO  Consults: None  Significant Diagnostic Studies: labs:  CBC    Component Value Date/Time   WBC 14.3* 11/23/2012 0530   RBC 3.42* 11/23/2012 0530   HGB 9.9* 11/23/2012 0530   HCT 30.4* 11/23/2012 0530   PLT 293 11/23/2012 0530   MCV 88.9 11/23/2012 0530   MCH 28.9 11/23/2012 0530   MCHC 32.6 11/23/2012 0530   RDW 15.5 11/23/2012 0530      Treatments: surgery: LAVH  Discharge Exam: Blood pressure 136/77, pulse 63, temperature 97.9 F (36.6 C), temperature source Oral, resp. rate 18, height 5\' 4"  (1.626 m), weight 158 lb (71.668 kg), last menstrual period 08/01/2012, SpO2 97.00%. abd soft + BS, Incs C/D  Disposition: 01-Home or Self Care     Medication List     As of 11/23/2012 10:34 AM    STOP taking these medications         Norethindrone-Ethinyl Estradiol-Fe Biphas 1 MG-10 MCG / 10 MCG tablet   Commonly known as: LO LOESTRIN FE      OVER THE COUNTER MEDICATION      vitamin C 500 MG tablet   Commonly known as: ASCORBIC ACID      TAKE these medications         ferrous sulfate 325 (65 FE) MG tablet   Take 650 mg by mouth daily with breakfast.      ibuprofen 800 MG tablet   Commonly known as: ADVIL,MOTRIN   Take 1 tablet (800 mg total) by mouth every 8 (eight) hours as needed (mild pain).           Follow-up Information    Schedule an appointment as soon as possible for a visit with Meriel Pica, MD. (office will call)    Contact information:   [address] [place] [address] [phone]          Signed: Meriel Pica 11/23/2012, 10:34  AM

## 2012-11-23 NOTE — Progress Notes (Signed)
Pt.. Is discharged in the care of husband. Denies any heavy vaginal bleeding,pain nausea vomiting.. Lapsites are clean and dry. Discharge instruction were given with good understanding. Questions were asked and answered. Stable.

## 2013-01-23 ENCOUNTER — Other Ambulatory Visit: Payer: Self-pay | Admitting: Dermatology

## 2013-06-29 ENCOUNTER — Emergency Department (HOSPITAL_COMMUNITY)
Admission: EM | Admit: 2013-06-29 | Discharge: 2013-06-29 | Disposition: A | Discharge status: Home / Self Care | Payer: 59 | Source: Home / Self Care | Attending: Emergency Medicine | Admitting: Emergency Medicine

## 2013-06-29 ENCOUNTER — Encounter (HOSPITAL_COMMUNITY): Payer: Self-pay | Admitting: Emergency Medicine

## 2013-06-29 DIAGNOSIS — H8110 Benign paroxysmal vertigo, unspecified ear: Secondary | ICD-10-CM

## 2013-06-29 HISTORY — DX: Iron deficiency: E61.1

## 2013-06-29 LAB — POCT I-STAT, CHEM 8
Chloride: 104 mEq/L (ref 96–112)
Creatinine, Ser: 0.8 mg/dL (ref 0.50–1.10)
Glucose, Bld: 90 mg/dL (ref 70–99)
HCT: 48 % — ABNORMAL HIGH (ref 36.0–46.0)
Hemoglobin: 16.3 g/dL — ABNORMAL HIGH (ref 12.0–15.0)
Potassium: 4.1 mEq/L (ref 3.5–5.1)
Sodium: 142 mEq/L (ref 135–145)

## 2013-06-29 MED ORDER — MECLIZINE HCL 25 MG PO TABS: 25.0000 mg | ORAL_TABLET | Freq: Four times a day (QID) | ORAL | Status: DC

## 2013-06-29 NOTE — ED Provider Notes (Signed)
Chief Complaint:   Chief Complaint  Patient presents with  . Dizziness    History of Present Illness:   Kristen Lopez is a 52 year old female who has a long and complicated medical history. She has had a one-year history of pain in her neck, shoulders, and back. She has been seeing a chiropractor for this for about the past year. About 2-3 weeks ago he started her on some supplements because of the pain. Both supplements had multiple ingredients including vitamins and various herbal remedies. After she started the supplements she felt lightheaded and he told her to cut back but not stop completely. He did some lab tests which she was told showed iron deficiency and evidence of some type of infection or inflammation. In reviewing her lab results, all of her labs appear to be in the normal range including her hemoglobin, hematocrit, ferritin, iron, iron-binding capacity. Her chiropractor told her that the normal ranges which physicians use are not very good and that he recommends better levels for optimum health. She had a normal white blood cell count, but she did have somewhat of a left shift. She denies any fever or chills. For the past week she's had episodes of near-syncope, vertigo, and felt lightheaded. The symptoms are worse in the morning and better during the day. They get worse after she takes a nap. They're often brought on by position changes. She denies any ear pain or difficulty hearing, ringing in ears, or focal neurological symptoms. She's also had symptoms of heart racing, anxiety, difficulty focusing, has felt confused, and felt tired and rundown and is lacking energy.  Review of Systems:  Other than noted above, the patient denies any of the following symptoms. Systemic:  No fever, chills, sweats, fatigue, myalgias, headache, or anorexia. Eye:  No redness, pain or drainage. ENT:  No earache, nasal congestion, rhinorrhea, sinus pressure, or sore throat. Lungs:  No cough, sputum  production, wheezing, shortness of breath.  Cardiovascular:  No chest pain, palpitations, or syncope. GI:  No nausea, vomiting, abdominal pain or diarrhea. GU:  No dysuria, frequency, or hematuria. Skin:  No rash or pruritis.  PMFSH:  Past medical history, family history, social history, meds, and allergies were reviewed.   Physical Exam:   Vital signs:  BP 118/90  Pulse 90  Temp(Src) 98.3 F (36.8 C) (Oral)  Resp 16  SpO2 100%  LMP 08/01/2012 Filed Vitals:   06/29/13 1415 Sitting  06/29/13 1520 Supine  06/29/13 1521  Standing   BP: 127/81 128/88 118/90  Pulse: 80 82 90  Temp: 98.3 F (36.8 C)    TempSrc: Oral    Resp: 16    SpO2: 100%     General:  Alert, in no distress. Eye:  PERRL, full EOMs.  Lids and conjunctivas were normal. ENT:  TMs and canals were normal, without erythema or inflammation.  Nasal mucosa was clear and uncongested, without drainage.  Mucous membranes were moist.  Pharynx was clear, without exudate or drainage.  There were no oral ulcerations or lesions. Neck:  Supple, no adenopathy, tenderness or mass. Thyroid was normal. Lungs:  No respiratory distress.  Lungs were clear to auscultation, without wheezes, rales or rhonchi.  Breath sounds were clear and equal bilaterally. Heart:  Regular rhythm, without gallops, murmers or rubs. Abdomen:  Soft, flat, and non-tender to palpation.  No hepatosplenomagaly or mass. Extremities: No edema, pulses were full. Neurological exam: Alert and oriented x3, speech was clear. Muscle strength and cranial nerves were intact.  Hallpike Dix maneuver was positive with the right ear down. Skin:  Clear, warm, and dry, without rash or lesions.  Labs:   Results for orders placed during the hospital encounter of 06/29/13  POCT I-STAT, CHEM 8      Result Value Range   Sodium 142  135 - 145 mEq/L   Potassium 4.1  3.5 - 5.1 mEq/L   Chloride 104  96 - 112 mEq/L   BUN 7  6 - 23 mg/dL   Creatinine, Ser 1.61  0.50 - 1.10 mg/dL    Glucose, Bld 90  70 - 99 mg/dL   Calcium, Ion 0.96  1.12 - 1.23 mmol/L   TCO2 26  0 - 100 mmol/L   Hemoglobin 16.3 (*) 12.0 - 15.0 g/dL   HCT 04.5 (*) 40.9 - 81.1 %    EKG Results:  Date: 06/29/2013  Rate: 67  Rhythm: normal sinus rhythm  QRS Axis: normal  Intervals: normal  ST/T Wave abnormalities: normal  Conduction Disutrbances:none  Narrative Interpretation: Normal sinus rhythm, normal EKG.  Old EKG Reviewed: none available  Assessment:  The encounter diagnosis was Benign positional vertigo.  She seems to have many concerns about her health, and unfortunately has bought in to a chiropractor's ideas about health and supplements. It appears that her main acute problem is that of benign positional vertigo. I explained to her the pathogenesis of this and the treatment which would include Epley exercises and Antivert as needed. I suggested if no better in a week that she see an ENT specialist. I doubt that we will find a cause for her confusion and difficulty focusing. I suggested that she may want to see a neurologist about this for further evaluation. She seems satisfied with this advice.  Plan:   1.  The following meds were prescribed:   Discharge Medication List as of 06/29/2013  3:39 PM    START taking these medications   Details  meclizine (ANTIVERT) 25 MG tablet Take 1 tablet (25 mg total) by mouth 4 (four) times daily., Starting 06/29/2013, Until Discontinued, Normal       2.  The patient was instructed in symptomatic care and handouts were given. 3.  The patient was told to return if becoming worse in any way, if no better in 3 or 4 days, and given some red flag symptoms such as any new or changing neurological symptoms that would indicate earlier return. 4.  Follow up with Dr. Darnell Level for the vertigo if no better in a week and with Dr. Porfirio Mylar Dohmeier for the confusion and difficulty focusing.       Reuben Likes, MD 06/29/13 [phone]

## 2013-06-29 NOTE — ED Notes (Addendum)
Patient gives extensive and detailed history of "natural supplements" and visits with chiropractors and "holistic"approaches to her health concerns.  C/o light headedness that was noticed Sunday.  Reports episodes of lightheaded ness every morning after waking, including late afternoon nap that was followed by lightheadedness.  Patient related these episodes to the most recent supplement additions.  Stopped taking clearvite psf and adrenacalm.  Last night felt anxious and heart racing.  Denies pain.  Patient originally seeking medical attention for back, neck, and shoulder pain when the physician started patient on supplements

## 2013-07-10 ENCOUNTER — Telehealth: Payer: Self-pay

## 2013-07-10 NOTE — Telephone Encounter (Signed)
PT called and stated that she knows Dr. Clent Ridges through church, and that she would like to be seen asap as a new patient. She then requested that Dr. Clent Ridges give her a call. May I schedule this? Thank you!

## 2013-07-13 NOTE — Telephone Encounter (Signed)
Sorry but I am not accepting new patients but I recommend Padonda or Dr. Selena Batten for her

## 2013-07-13 NOTE — Telephone Encounter (Signed)
Notified pt on personally identified voicemail.

## 2013-07-24 DIAGNOSIS — R209 Unspecified disturbances of skin sensation: Secondary | ICD-10-CM | POA: Insufficient documentation

## 2013-07-24 DIAGNOSIS — R42 Dizziness and giddiness: Secondary | ICD-10-CM | POA: Insufficient documentation

## 2013-07-24 DIAGNOSIS — Z862 Personal history of diseases of the blood and blood-forming organs and certain disorders involving the immune mechanism: Secondary | ICD-10-CM | POA: Insufficient documentation

## 2013-07-24 DIAGNOSIS — M542 Cervicalgia: Secondary | ICD-10-CM | POA: Insufficient documentation

## 2013-07-30 ENCOUNTER — Encounter: Payer: Self-pay | Admitting: Neurology

## 2013-07-30 ENCOUNTER — Ambulatory Visit (INDEPENDENT_AMBULATORY_CARE_PROVIDER_SITE_OTHER): Payer: 59 | Admitting: Neurology

## 2013-07-30 VITALS — BP 125/81 | HR 70 | Ht 64.5 in | Wt 150.0 lb

## 2013-07-30 DIAGNOSIS — R42 Dizziness and giddiness: Secondary | ICD-10-CM

## 2013-07-30 DIAGNOSIS — Z862 Personal history of diseases of the blood and blood-forming organs and certain disorders involving the immune mechanism: Secondary | ICD-10-CM

## 2013-07-30 DIAGNOSIS — R209 Unspecified disturbances of skin sensation: Secondary | ICD-10-CM

## 2013-07-30 DIAGNOSIS — M542 Cervicalgia: Secondary | ICD-10-CM

## 2013-07-30 NOTE — Progress Notes (Signed)
Reason for visit: Dizziness  Kristen Lopez is a 52 y.o. female  History of present illness:  Kristen Lopez is a 52 year old right-handed white female with a history of onset of problems with dizziness and lightheaded sensations in June of 2014. The patient indicates that she has had episodes of the sensation of near-syncope, and she also has episodes of lightheaded floaty feelings, sometimes associated with a rocking sensations. The patient indicates that there is no particular head movement that brings on the dizziness, and the dizziness may occur while she is up and moving, or while sitting. The patient does have some intermittent neck and shoulder discomfort, and some migratory low back discomfort. The patient does not relate the dizziness sensations to increases in neck or shoulder discomfort. The patient has been seen by a chiropractor, without significant benefit. The patient reports a throbbing sensation in the feet, and some sensation of fatigue. The patient has noted that at times, the dizziness and the throbbing sensations in the feet, together. The feet may be involved together, or separately. The patient denies any significant balance issues or problems controlling the bowels. The patient does have some occasional stress incontinence of the bladder. The patient denies any pain radiating down the arms. The patient denies any changes in hearing, ringing in the ears, or ear pain. The patient indicates that meclizine may help her dizziness when the dizziness is present. The episodes of dizziness may last 2 or 3 hours. The patient reports no history of headache. The patient denies double vision or loss of vision. There is no history of slurred speech or problems with swallowing.  Past Medical History  Diagnosis Date  . MVP (mitral valve prolapse)   . Palpitations     1980  . Anemia 09/2012  . Shortness of breath     with anemia  . Low iron     Past Surgical History  Procedure  Laterality Date  . External ear surgery    . Dilitation & currettage/hystroscopy with novasure ablation  11/17/2012    Procedure: DILATATION & CURETTAGE/HYSTEROSCOPY WITH NOVASURE ABLATION;  Surgeon: Meriel Pica, MD;  Location: [facility];  Service: Gynecology;  Laterality: N/A;  Attempted and failed  . Dilitation & currettage/hystroscopy with thermachoice ablation  11/17/2012    Procedure: DILATATION & CURETTAGE/HYSTEROSCOPY WITH THERMACHOICE ABLATION;  Surgeon: Meriel Pica, MD;  Location: [facility];  Service: Gynecology;  Laterality: N/A;  . Laparoscopic assisted vaginal hysterectomy  11/22/2012    Procedure: LAPAROSCOPIC ASSISTED VAGINAL HYSTERECTOMY;  Surgeon: Meriel Pica, MD;  Location: [facility];  Service: Gynecology;  Laterality: N/A;  . Abdominal hysterectomy      Family History  Problem Relation Age of Onset  . Other Father 10    Car accident  . Alcohol abuse Mother 17  . Diabetes Mother   . Hypertension Mother   . Arthritis Mother   . Heart failure Maternal Grandmother   . Hypertension Paternal Grandmother   . ALS Paternal Grandfather     Social history:  reports that she has never smoked. She has never used smokeless tobacco. She reports that she drinks about 0.6 ounces of alcohol per week. She reports that she does not use illicit drugs.  Medications:  Current Outpatient Prescriptions on File Prior to Visit  Medication Sig Dispense Refill  . b complex vitamins tablet Take 2 tablets by mouth daily.       . Cholecalciferol (BIO-D-MULSION FORTE PO) Take by mouth.      Marland Kitchen  ibuprofen (ADVIL,MOTRIN) 800 MG tablet Take 1 tablet (800 mg total) by mouth every 8 (eight) hours as needed (mild pain).  30 tablet  2  . meclizine (ANTIVERT) 25 MG tablet Take 1 tablet (25 mg total) by mouth 4 (four) times daily.  28 tablet  0  . OVER THE COUNTER MEDICATION B.HCL, Thymex, Ferro Food, Thytrophin PMG, [place]       No current facility-administered medications on file prior to  visit.    Allergies:  Allergies  Allergen Reactions  . Erythromycin Other (See Comments)    No rash, "just makes me feel bad"    ROS:  Out of a complete 14 system review of symptoms, the patient complains only of the following symptoms, and all other reviewed systems are negative.  Fatigue Slight hearing loss Dizziness Blurred vision Feeling hot, increased thirst Achy muscles  Blood pressure 125/81, pulse 70, height 5' 4.5" (1.638 m), weight 150 lb (68.04 kg), last menstrual period 08/01/2012.  Physical Exam  General: The patient is alert and cooperative at the time of the examination.  Head: Pupils are equal, round, and reactive to light. Discs are flat bilaterally.  Neck: The neck is supple, no carotid bruits are noted.  Respiratory: The respiratory examination is clear.  Cardiovascular: The cardiovascular examination reveals a regular rate and rhythm, no obvious murmurs or rubs are noted.  Neuromuscular: Range of movement of the cervical spine is full. No crepitus is noted in the temporomandibular joints.  Skin: Extremities are without significant edema.  Neurologic Exam  Mental status:  Cranial nerves: Facial symmetry is present. There is good sensation of the face to pinprick and soft touch bilaterally. The strength of the facial muscles and the muscles to head turning and shoulder shrug are normal bilaterally. Speech is well enunciated, no aphasia or dysarthria is noted. Extraocular movements are full. Visual fields are full.  Motor: The motor testing reveals 5 over 5 strength of all 4 extremities. Good symmetric motor tone is noted throughout.  Sensory: Sensory testing is intact to pinprick, soft touch, vibration sensation, and position sense on all 4 extremities, with the exception that position sense is decreased on the right foot. No evidence of extinction is noted.  Coordination: Cerebellar testing reveals good finger-nose-finger and heel-to-shin  bilaterally. The Nyan-Barrany procedure was negative.  Gait and station: Gait is normal. Tandem gait is normal. Romberg is negative. No drift is seen.  Reflexes: Deep tendon reflexes are symmetric and normal bilaterally. Toes are downgoing bilaterally.   Assessment/Plan:  1. Migratory neuromuscular discomfort  2. Episodic dizziness  3. Throbbing sensations, feet  The patient has migratory neuromuscular symptoms that may represent fibromyalgia. The dizziness that she is having is somewhat nonspecific, and there does not appear to be a true positional component to her dizziness. The patient does not relate the neck discomfort and shoulder discomfort to the dizziness. The patient will be set up for MRI evaluation of the brain and cervical spine. The patient has already had some blood work done to evaluate for B12 deficiencies, and a vitamin B12 level was normal. The patient will follow about 4 months. If the MRI studies are unremarkable, treatment for her fibromyalgia may be initiated. Demyelinating disease does need to be considered and ruled out.  Marlan Palau MD 07/30/2013 8:07 PM  [facility] [address] [place], [place] [address]  Phone [phone] Fax [phone]

## 2013-08-07 ENCOUNTER — Other Ambulatory Visit: Payer: Self-pay | Admitting: Neurology

## 2013-08-07 DIAGNOSIS — R42 Dizziness and giddiness: Secondary | ICD-10-CM

## 2013-08-21 DIAGNOSIS — R42 Dizziness and giddiness: Secondary | ICD-10-CM

## 2013-08-23 ENCOUNTER — Other Ambulatory Visit: Payer: Self-pay | Admitting: Diagnostic Neuroimaging

## 2013-08-23 ENCOUNTER — Telehealth: Payer: Self-pay | Admitting: Neurology

## 2013-08-23 DIAGNOSIS — R42 Dizziness and giddiness: Secondary | ICD-10-CM

## 2013-08-23 DIAGNOSIS — M542 Cervicalgia: Secondary | ICD-10-CM

## 2013-08-23 NOTE — Telephone Encounter (Signed)
I called patient. MRI evaluation of the brain and cervical spine are normal. If the patient desires medications for presumed fibromyalgia, I will initiate low-dose Lyrica.

## 2013-11-21 DIAGNOSIS — I10 Essential (primary) hypertension: Secondary | ICD-10-CM | POA: Insufficient documentation

## 2013-11-21 DIAGNOSIS — L02519 Cutaneous abscess of unspecified hand: Secondary | ICD-10-CM | POA: Insufficient documentation

## 2013-11-27 ENCOUNTER — Ambulatory Visit: Payer: 59 | Admitting: Nurse Practitioner

## 2013-11-28 DIAGNOSIS — S61209A Unspecified open wound of unspecified finger without damage to nail, initial encounter: Secondary | ICD-10-CM | POA: Insufficient documentation

## 2014-05-01 ENCOUNTER — Telehealth: Payer: Self-pay | Admitting: *Deleted

## 2014-05-01 NOTE — Telephone Encounter (Signed)
I have a little round big pimple shape thing on my heel.  It has a black center.  Sometime it's painful other times not.  I feel it when I put down my heel.  I work in [employer], standing in one place.  I have something on the other foot on the big toe that's similar but it doesn't hurt like the heel.  I left her a message that we can't make any recommendations for treatment without seeing her.  We can't determine what it may be without seeing it.  Please call and schedule an appointment.

## 2014-05-14 ENCOUNTER — Ambulatory Visit: Payer: 59

## 2014-09-12 DIAGNOSIS — S86912A Strain of unspecified muscle(s) and tendon(s) at lower leg level, left leg, initial encounter: Secondary | ICD-10-CM | POA: Insufficient documentation

## 2014-10-28 ENCOUNTER — Encounter: Payer: Self-pay | Admitting: Neurology

## 2016-10-14 ENCOUNTER — Ambulatory Visit: Payer: Commercial Managed Care - HMO | Admitting: Podiatry

## 2016-10-19 ENCOUNTER — Ambulatory Visit (INDEPENDENT_AMBULATORY_CARE_PROVIDER_SITE_OTHER): Payer: Commercial Managed Care - HMO | Admitting: Podiatry

## 2016-10-19 ENCOUNTER — Encounter: Payer: Self-pay | Admitting: Podiatry

## 2016-10-19 ENCOUNTER — Ambulatory Visit (INDEPENDENT_AMBULATORY_CARE_PROVIDER_SITE_OTHER): Payer: Commercial Managed Care - HMO

## 2016-10-19 ENCOUNTER — Other Ambulatory Visit: Payer: Self-pay | Admitting: *Deleted

## 2016-10-19 DIAGNOSIS — M2012 Hallux valgus (acquired), left foot: Secondary | ICD-10-CM

## 2016-10-19 DIAGNOSIS — M722 Plantar fascial fibromatosis: Secondary | ICD-10-CM

## 2016-10-19 NOTE — Progress Notes (Signed)
   Subjective:    Patient ID: Kristen Lopez, female    DOB: October 01, 1961, 55 y.o.   MRN: [id number]  HPI she presents today chief complaint of a painful bunion that she would like to have surgery performed on in the spring time. She would like to  Orthotics primarily. She states that she has a history of plantar fasciitis and feels the fasciitis is returning.    Review of Systems  Eyes: Positive for redness.  Musculoskeletal: Positive for arthralgias.  All other systems reviewed and are negative.      Objective:   Physical Exam: Physical exam demonstrates pulses are palpable bilateral. Neurologic sensorium is intact mild tenderness on palpation of the plantar fascia insertion site.        Assessment & Plan:  Plantar fasciitis hallux abductovalgus bilateral.  Plan: She is scheduled for 7 orthotics today. Follow up with us once his Coumadin.

## 2016-11-16 ENCOUNTER — Other Ambulatory Visit: Payer: Commercial Managed Care - HMO

## 2016-11-23 ENCOUNTER — Ambulatory Visit (INDEPENDENT_AMBULATORY_CARE_PROVIDER_SITE_OTHER): Payer: Commercial Managed Care - HMO

## 2016-11-23 DIAGNOSIS — M722 Plantar fascial fibromatosis: Secondary | ICD-10-CM

## 2016-11-23 NOTE — Patient Instructions (Signed)

## 2016-11-23 NOTE — Progress Notes (Signed)
Patient presents for orthotic pick up.  Verbal and written break in and wear instructions given.  Patient will follow up in 4 weeks if symptoms worsen or fail to improve. 

## 2017-08-06 ENCOUNTER — Encounter (HOSPITAL_COMMUNITY): Payer: Self-pay | Admitting: Family Medicine

## 2017-08-06 ENCOUNTER — Ambulatory Visit (HOSPITAL_COMMUNITY)
Admission: EM | Admit: 2017-08-06 | Discharge: 2017-08-06 | Disposition: A | Discharge status: Home / Self Care | Payer: 59 | Attending: Family Medicine | Admitting: Family Medicine

## 2017-08-06 DIAGNOSIS — H1131 Conjunctival hemorrhage, right eye: Secondary | ICD-10-CM

## 2017-08-06 MED ORDER — EYE WASH OPHTH SOLN
OPHTHALMIC | Status: AC
  Filled 2017-08-06: qty 118

## 2017-08-06 MED ORDER — FLUORESCEIN SODIUM 0.6 MG OP STRP
ORAL_STRIP | OPHTHALMIC | Status: AC
  Filled 2017-08-06: qty 1

## 2017-08-06 NOTE — Discharge Instructions (Signed)
Generally speaking, a sub-conjunctival hemorrhages self-limited meaning that it goes away by itself. You may use saline eye wash for comfort, but no treatment is necessary since this will probably go away in a week.

## 2017-08-06 NOTE — ED Provider Notes (Signed)
[facility]    CSN: [id number] Arrival date & time: 08/06/17  1543     History   Chief Complaint Chief Complaint  Patient presents with  . Eye Problem    HPI Kristen Lopez is a 56 y.o. female.   This is a 56 year old woman with eye irritation on the right side over the past day. She does not recall any foreign body getting into her eye. She's had no unusual activities recently. There's been no trauma to the right eye. It has become redder on the outside of the right eye. She's had no frank pain.  She says that she seen her eye doctor on Tuesday and sees Dr. Hazle Quantigby. She has had gradually decreasing visual acuity over a long period of time and feels that she needs to have visual acuity correction.      Past Medical History:  Diagnosis Date  . Anemia 09/2012  . Low iron   . MVP (mitral valve prolapse)   . Palpitations    1980  . Shortness of breath    with anemia    Patient Active Problem List   Diagnosis Date Noted  . Dizziness and giddiness 07/24/2013  . Disturbance of skin sensation 07/24/2013  . Cervicalgia 07/24/2013  . Personal history of diseases of blood and blood-forming organs 07/24/2013    Past Surgical History:  Procedure Laterality Date  . ABDOMINAL HYSTERECTOMY    . DILITATION & CURRETTAGE/HYSTROSCOPY WITH NOVASURE ABLATION  11/17/2012   Procedure: DILATATION & CURETTAGE/HYSTEROSCOPY WITH NOVASURE ABLATION;  Surgeon: Meriel Picaichard M Holland, MD;  Location: [facility];  Service: Gynecology;  Laterality: N/A;  Attempted and failed  . DILITATION & CURRETTAGE/HYSTROSCOPY WITH THERMACHOICE ABLATION  11/17/2012   Procedure: DILATATION & CURETTAGE/HYSTEROSCOPY WITH THERMACHOICE ABLATION;  Surgeon: Meriel Picaichard M Holland, MD;  Location: [facility];  Service: Gynecology;  Laterality: N/A;  . EXTERNAL EAR SURGERY    . LAPAROSCOPIC ASSISTED VAGINAL HYSTERECTOMY  11/22/2012   Procedure: LAPAROSCOPIC ASSISTED VAGINAL HYSTERECTOMY;  Surgeon: Meriel Picaichard M Holland, MD;   Location: [facility];  Service: Gynecology;  Laterality: N/A;    OB History    Gravida Para Term Preterm AB Living   3 3 3     3    SAB TAB Ectopic Multiple Live Births                   Home Medications    Prior to Admission medications   Medication Sig Start Date End Date Taking? Authorizing Provider  b complex vitamins tablet Take 2 tablets by mouth daily.     [provider]  Boswellia Serrata (BOSWELLIA PO) Take 1 tablet by mouth daily.    [provider]  CALCIUM-MAGNESIUM-ZINC PO Take 1 tablet by mouth daily.    [provider]  Cholecalciferol (BIO-D-MULSION FORTE PO) Take by mouth.    [provider]  Cholecalciferol (VITAMIN D3) 3000 UNITS TABS Take 3,000 Units by mouth daily.    [provider]  cyanocobalamin 2000 MCG tablet Take 2,000 mcg by mouth daily.    [provider]  ibuprofen (ADVIL,MOTRIN) 800 MG tablet Take 1 tablet (800 mg total) by mouth every 8 (eight) hours as needed (mild pain). 11/23/12   Richarda OverlieHolland, Richard, MD  meclizine (ANTIVERT) 25 MG tablet Take 1 tablet (25 mg total) by mouth 4 (four) times daily. 06/29/13   Reuben LikesKeller, David C, MD  OVER THE COUNTER MEDICATION B.HCL, Thymex, [employer], Thytrophin PMG, Liv[employer]    [provider]  Lillia MountainPREMARIN  vaginal cream  08/25/16   [provider]  tiZANidine (ZANAFLEX) 2 MG tablet  08/25/16   [provider]  vitamin E 1000 UNIT capsule Take 1,000 Units by mouth daily.    [provider]    Family History Family History  Problem Relation Age of Onset  . Other Father 44       Car accident  . Alcohol abuse Mother 38  . Diabetes Mother   . Hypertension Mother   . Arthritis Mother   . Heart failure Maternal Grandmother   . Hypertension Paternal Grandmother   . ALS Paternal Grandfather     Social History Social History  Substance Use Topics  . Smoking status: Never Smoker  . Smokeless tobacco: Never Used  . Alcohol use 0.6 oz/week     1 Glasses of wine per week     Comment: socially     Allergies   Erythromycin   Review of Systems Review of Systems  Eyes: Positive for redness and itching.  All other systems reviewed and are negative.    Physical Exam Triage Vital Signs ED Triage Vitals  Enc Vitals Group     BP      Pulse      Resp      Temp      Temp src      SpO2      Weight      Height      Head Circumference      Peak Flow      Pain Score      Pain Loc      Pain Edu?      Excl. in GC?    No data found.   Updated Vital Signs BP 120/85   Pulse 68   Temp 98.4 F (36.9 C)   Resp 18   LMP 08/01/2012   SpO2 98%    Physical Exam  Constitutional: She is oriented to person, place, and time. She appears well-developed and well-nourished.  Eyes: Pupils are equal, round, and reactive to light.  Small right lateral conjunctival hemorrhage. Funduscopic exam negative.  Lid eversion negative. Flouriscein staining negative  Musculoskeletal: Normal range of motion.  Neurological: She is alert and oriented to person, place, and time.  Skin: Skin is warm and dry.  Nursing note and vitals reviewed.    UC Treatments / Results  Labs (all labs ordered are listed, but only abnormal results are displayed) Labs Reviewed - No data to display  EKG  EKG Interpretation None       Radiology No results found.  Procedures Procedures (including critical care time)  Medications Ordered in UC Medications - No data to display   Initial Impression / Assessment and Plan / UC Course  I have reviewed the triage vital signs and the nursing notes.  Pertinent labs & imaging results that were available during my care of the patient were reviewed by me and considered in my medical decision making (see chart for details).     Final Clinical Impressions(s) / UC Diagnoses   Final diagnoses:  Subconjunctival hematoma, right    New Prescriptions New Prescriptions   No medications on file      Controlled Substance Prescriptions [place] Controlled Substance Registry consulted? Not Applicable   Elvina Sidle, MD 08/06/17 [phone]

## 2017-08-06 NOTE — ED Triage Notes (Signed)
Pt here for right eye tenderness and blood in eye. sts irritated. Woke up that way this am.

## 2017-08-09 DIAGNOSIS — H11153 Pinguecula, bilateral: Secondary | ICD-10-CM | POA: Diagnosis not present

## 2017-08-09 DIAGNOSIS — H1131 Conjunctival hemorrhage, right eye: Secondary | ICD-10-CM | POA: Diagnosis not present

## 2017-08-09 DIAGNOSIS — H524 Presbyopia: Secondary | ICD-10-CM | POA: Diagnosis not present

## 2017-09-06 DIAGNOSIS — M65331 Trigger finger, right middle finger: Secondary | ICD-10-CM | POA: Diagnosis not present

## 2017-09-06 DIAGNOSIS — M778 Other enthesopathies, not elsewhere classified: Secondary | ICD-10-CM | POA: Diagnosis not present

## 2017-09-20 DIAGNOSIS — M65331 Trigger finger, right middle finger: Secondary | ICD-10-CM | POA: Diagnosis not present

## 2018-02-22 DIAGNOSIS — J019 Acute sinusitis, unspecified: Secondary | ICD-10-CM | POA: Diagnosis not present

## 2018-02-22 DIAGNOSIS — M25641 Stiffness of right hand, not elsewhere classified: Secondary | ICD-10-CM | POA: Diagnosis not present

## 2018-03-09 DIAGNOSIS — M25541 Pain in joints of right hand: Secondary | ICD-10-CM | POA: Diagnosis not present

## 2018-03-09 DIAGNOSIS — Z0000 Encounter for general adult medical examination without abnormal findings: Secondary | ICD-10-CM | POA: Diagnosis not present

## 2018-03-09 DIAGNOSIS — E7800 Pure hypercholesterolemia, unspecified: Secondary | ICD-10-CM | POA: Diagnosis not present

## 2018-04-26 DIAGNOSIS — M65331 Trigger finger, right middle finger: Secondary | ICD-10-CM | POA: Diagnosis not present

## 2018-04-26 DIAGNOSIS — M79641 Pain in right hand: Secondary | ICD-10-CM | POA: Insufficient documentation

## 2018-04-26 DIAGNOSIS — M6530 Trigger finger, unspecified finger: Secondary | ICD-10-CM | POA: Insufficient documentation

## 2018-05-04 DIAGNOSIS — H11121 Conjunctival concretions, right eye: Secondary | ICD-10-CM | POA: Diagnosis not present

## 2018-05-29 DIAGNOSIS — M79641 Pain in right hand: Secondary | ICD-10-CM | POA: Diagnosis not present

## 2018-05-29 DIAGNOSIS — M65331 Trigger finger, right middle finger: Secondary | ICD-10-CM | POA: Diagnosis not present

## 2018-06-13 ENCOUNTER — Encounter (HOSPITAL_COMMUNITY): Payer: Self-pay

## 2018-06-13 ENCOUNTER — Emergency Department (HOSPITAL_COMMUNITY)
Admission: EM | Admit: 2018-06-13 | Discharge: 2018-06-13 | Disposition: A | Discharge status: Home / Self Care | Payer: 59 | Attending: Emergency Medicine | Admitting: Emergency Medicine

## 2018-06-13 ENCOUNTER — Other Ambulatory Visit: Payer: Self-pay

## 2018-06-13 DIAGNOSIS — R42 Dizziness and giddiness: Secondary | ICD-10-CM | POA: Insufficient documentation

## 2018-06-13 DIAGNOSIS — Z79899 Other long term (current) drug therapy: Secondary | ICD-10-CM | POA: Insufficient documentation

## 2018-06-13 DIAGNOSIS — I10 Essential (primary) hypertension: Secondary | ICD-10-CM | POA: Insufficient documentation

## 2018-06-13 LAB — CBC
HCT: 44.6 % (ref 36.0–46.0)
Hemoglobin: 15.1 g/dL — ABNORMAL HIGH (ref 12.0–15.0)
MCH: 31.1 pg (ref 26.0–34.0)
MCHC: 33.9 g/dL (ref 30.0–36.0)
MCV: 91.8 fL (ref 78.0–100.0)
PLATELETS: 277 10*3/uL (ref 150–400)
RBC: 4.86 MIL/uL (ref 3.87–5.11)
RDW: 12.1 % (ref 11.5–15.5)
WBC: 10.4 10*3/uL (ref 4.0–10.5)

## 2018-06-13 LAB — BASIC METABOLIC PANEL
Anion gap: 9 (ref 5–15)
BUN: 16 mg/dL (ref 6–20)
CALCIUM: 9.3 mg/dL (ref 8.9–10.3)
CO2: 26 mmol/L (ref 22–32)
CREATININE: 0.86 mg/dL (ref 0.44–1.00)
Chloride: 105 mmol/L (ref 101–111)
GFR calc non Af Amer: >60 mL/min (ref >60)
Glucose, Bld: 125 mg/dL — ABNORMAL HIGH (ref 65–99)
Potassium: 3.8 mmol/L (ref 3.5–5.1)
SODIUM: 140 mmol/L (ref 135–145)

## 2018-06-13 MED ORDER — ONDANSETRON HCL 4 MG PO TABS: 4.0000 mg | ORAL_TABLET | Freq: Four times a day (QID) | ORAL | 0 refills | Status: DC

## 2018-06-13 MED ORDER — LORATADINE 10 MG PO TABS: 10.0000 mg | ORAL_TABLET | Freq: Every day | ORAL | 0 refills | Status: DC

## 2018-06-13 MED ORDER — MECLIZINE HCL 12.5 MG PO TABS: 12.5000 mg | ORAL_TABLET | Freq: Three times a day (TID) | ORAL | 0 refills | Status: DC | PRN

## 2018-06-13 NOTE — ED Provider Notes (Signed)
[facility]-EMERGENCY DEPT Provider Note   CSN: [id number] Arrival date & time: 06/13/18  1746     History   Chief Complaint Chief Complaint  Patient presents with  . Dizziness  . Hypertension    HPI Kristen Lopez is a 57 y.o. female.  57 year old female with prior history of anemia, mitral valve prolapse, and vertigo since with complaint of vertiginous symptoms.  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  She reports feeling dizzy like the room was spinning around her.  She denies associated nausea.  Symptoms lasted approximately 45 minutes.  She did not take any medication for same.  Concurrently she noted that her blood pressure was mildly elevated into the 150's systolic.  She reports that this is new for her.  The last time she checked her blood pressure was 6 months ago at a routine doctor's appointment.  She does not currently take any antihypertensives.  The history is provided by the patient.  Dizziness  Quality:  Vertigo Severity:  Mild Onset quality:  Sudden Duration:  1 hour Timing:  Rare Progression:  Resolved Chronicity:  Recurrent Relieved by:  Nothing Worsened by:  Nothing Ineffective treatments:  None tried Hypertension     Past Medical History:  Diagnosis Date  . Anemia 09/2012  . Low iron   . MVP (mitral valve prolapse)   . Palpitations    1980  . Shortness of breath    with anemia    Patient Active Problem List   Diagnosis Date Noted  . Dizziness and giddiness 07/24/2013  . Disturbance of skin sensation 07/24/2013  . Cervicalgia 07/24/2013  . Personal history of diseases of blood and blood-forming organs 07/24/2013    Past Surgical History:  Procedure Laterality Date  . ABDOMINAL HYSTERECTOMY    . DILITATION & CURRETTAGE/HYSTROSCOPY WITH NOVASURE ABLATION  11/17/2012   Procedure: DILATATION & CURETTAGE/HYSTEROSCOPY WITH NOVASURE ABLATION;  Surgeon: Meriel Picaichard M Holland,  MD;  Location: [facility];  Service: Gynecology;  Laterality: N/A;  Attempted and failed  . DILITATION & CURRETTAGE/HYSTROSCOPY WITH THERMACHOICE ABLATION  11/17/2012   Procedure: DILATATION & CURETTAGE/HYSTEROSCOPY WITH THERMACHOICE ABLATION;  Surgeon: Meriel Picaichard M Holland, MD;  Location: [facility];  Service: Gynecology;  Laterality: N/A;  . EXTERNAL EAR SURGERY    . LAPAROSCOPIC ASSISTED VAGINAL HYSTERECTOMY  11/22/2012   Procedure: LAPAROSCOPIC ASSISTED VAGINAL HYSTERECTOMY;  Surgeon: Meriel Picaichard M Holland, MD;  Location: [facility];  Service: Gynecology;  Laterality: N/A;     OB History    Gravida  3   Para  3   Term  3   Preterm      AB      Living  3     SAB      TAB      Ectopic      Multiple      Live Births               Home Medications    Prior to Admission medications   Medication Sig Start Date End Date Taking? Authorizing Provider  Ascorbic Acid (VITAMIN C) 1000 MG tablet Take 1,000 mg by mouth daily.   Yes [provider]  cholecalciferol (VITAMIN D) 1000 units tablet Take 1,000 Units by mouth daily.    Yes [provider]  MAGNESIUM CITRATE PO Take 1 tablet by mouth daily.   Yes [provider]  Melatonin 5 MG TABS Take 5 mg by mouth at bedtime as  needed (sleep).   Yes [provider]  OVER THE COUNTER MEDICATION Take 1 tablet by mouth daily. "Vitamin Code" B.HCL, Thymex, [employer], Thytrophin PMG, Livapley.   Yes [provider]  loratadine (CLARITIN) 10 MG tablet Take 1 tablet (10 mg total) by mouth daily. 06/13/18   Wynetta Fines, MD  meclizine (ANTIVERT) 12.5 MG tablet Take 1 tablet (12.5 mg total) by mouth 3 (three) times daily as needed for dizziness. 06/13/18   Wynetta Fines, MD  ondansetron (ZOFRAN) 4 MG tablet Take 1 tablet (4 mg total) by mouth every 6 (six) hours. 06/13/18   Wynetta Fines, MD    Family History Family History  Problem Relation Age of Onset  . Other Father 39       Car accident  .  Alcohol abuse Mother 70  . Diabetes Mother   . Hypertension Mother   . Arthritis Mother   . Heart failure Maternal Grandmother   . Hypertension Paternal Grandmother   . ALS Paternal Grandfather     Social History Social History   Tobacco Use  . Smoking status: Never Smoker  . Smokeless tobacco: Never Used  Substance Use Topics  . Alcohol use: Yes    Alcohol/week: 0.6 oz    Types: 1 Glasses of wine per week    Comment: socially  . Drug use: No     Allergies   Amoxicillin; Azithromycin; and Erythromycin   Review of Systems Review of Systems  Neurological: Positive for dizziness.  All other systems reviewed and are negative.    Physical Exam Updated Vital Signs BP (!) 158/101 (BP Location: Left Arm)   Pulse 74   Temp 98.1 F (36.7 C) (Oral)   Resp 17   Ht 5\' 4"  (1.626 m)   Wt 77.1 kg (170 lb)   LMP 08/01/2012   SpO2 100%   BMI 29.18 kg/m   Physical Exam  Constitutional: She is oriented to person, place, and time. She appears well-developed and well-nourished. No distress.  HENT:  Head: Normocephalic and atraumatic.  Mouth/Throat: Oropharynx is clear and moist.  Eyes: Pupils are equal, round, and reactive to light. Conjunctivae and EOM are normal.  Neck: Normal range of motion. Neck supple.  Cardiovascular: Normal rate, regular rhythm and normal heart sounds.  Pulmonary/Chest: Effort normal and breath sounds normal. No respiratory distress.  Abdominal: Soft. She exhibits no distension. There is no tenderness.  Musculoskeletal: Normal range of motion. She exhibits no edema or deformity.  Neurological: She is alert and oriented to person, place, and time.  Alert and oriented x4 Normal speech  no facial droop  5 out of 5 strength of both upper and both lower extremities Normal gait   Skin: Skin is warm and dry.  Psychiatric: She has a normal mood and affect.  Nursing note and vitals reviewed.    ED Treatments / Results  Labs (all labs ordered are  listed, but only abnormal results are displayed) Labs Reviewed  BASIC METABOLIC PANEL - Abnormal; Notable for the following components:      Result Value   Glucose, Bld 125 (*)    All other components within normal limits  CBC - Abnormal; Notable for the following components:   Hemoglobin 15.1 (*)    All other components within normal limits  URINALYSIS, ROUTINE W REFLEX MICROSCOPIC    EKG None  Radiology No results found.  Procedures Procedures (including critical care time)  Medications Ordered in ED Medications - No data to  display   Initial Impression / Assessment and Plan / ED Course  I have reviewed the triage vital signs and the nursing notes.  Pertinent labs & imaging results that were available during my care of the patient were reviewed by me and considered in my medical decision making (see chart for details).     MDM  Screen complete  She is presenting for evaluation of vertigo with concurrently noted hypertension.  Patient reports that her vertiginous symptoms have resolved prior to arrival.  Patient's vertiginous symptoms are consistent with prior episodes of benign positional vertigo.  Reported hypertension is without evidence of endorgan damage.  I suspect that her blood pressure has been mildly elevated since her last doctor's visit 6 months ago.  There is no indication at this time for treatment of her blood pressure.    She understands need for close follow-up.  Strict return precautions are given and understood.  Final Clinical Impressions(s) / ED Diagnoses   Final diagnoses:  Vertigo  Hypertension, unspecified type    ED Discharge Orders        Ordered    meclizine (ANTIVERT) 12.5 MG tablet  3 times daily PRN     06/13/18 2142    ondansetron (ZOFRAN) 4 MG tablet  Every 6 hours     06/13/18 2142    loratadine (CLARITIN) 10 MG tablet  Daily     06/13/18 2142       Wynetta Fines, MD 06/13/18 2227

## 2018-06-13 NOTE — ED Triage Notes (Signed)
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 Patient states her friend took her BP at their house and was elevated. BP in triage- 166/101.

## 2018-06-13 NOTE — ED Notes (Signed)
MD at bedside. 

## 2018-06-13 NOTE — Discharge Instructions (Addendum)
Return for any problem.  Follow-up with your regular doctor tomorrow as instructed.  Use Antivert and Zofran as prescribed if your vertiginous symptoms return.

## 2018-06-14 DIAGNOSIS — R42 Dizziness and giddiness: Secondary | ICD-10-CM | POA: Diagnosis not present

## 2018-06-14 DIAGNOSIS — J069 Acute upper respiratory infection, unspecified: Secondary | ICD-10-CM | POA: Diagnosis not present

## 2018-06-14 DIAGNOSIS — Z09 Encounter for follow-up examination after completed treatment for conditions other than malignant neoplasm: Secondary | ICD-10-CM | POA: Diagnosis not present

## 2018-07-18 DIAGNOSIS — J0101 Acute recurrent maxillary sinusitis: Secondary | ICD-10-CM | POA: Diagnosis not present

## 2018-09-01 DIAGNOSIS — R030 Elevated blood-pressure reading, without diagnosis of hypertension: Secondary | ICD-10-CM | POA: Diagnosis not present

## 2019-01-09 DIAGNOSIS — R0981 Nasal congestion: Secondary | ICD-10-CM | POA: Diagnosis not present

## 2019-02-20 ENCOUNTER — Ambulatory Visit: Payer: 59 | Admitting: Allergy & Immunology

## 2019-02-20 ENCOUNTER — Encounter: Payer: Self-pay | Admitting: Allergy & Immunology

## 2019-02-20 VITALS — BP 120/86 | HR 79 | Resp 16 | Ht 63.5 in | Wt 163.4 lb

## 2019-02-20 DIAGNOSIS — J3089 Other allergic rhinitis: Secondary | ICD-10-CM

## 2019-02-20 DIAGNOSIS — J302 Other seasonal allergic rhinitis: Secondary | ICD-10-CM | POA: Diagnosis not present

## 2019-02-20 MED ORDER — MONTELUKAST SODIUM 10 MG PO TABS: 10.0000 mg | ORAL_TABLET | Freq: Every day | ORAL | 5 refills | Status: DC

## 2019-02-20 MED ORDER — AZELASTINE HCL 0.1 % NA SOLN: 2.0000 | Freq: Two times a day (BID) | NASAL | 5 refills | Status: DC

## 2019-02-20 NOTE — Patient Instructions (Addendum)
1. Chronic rhinitis - Testing today showed: trees, weeds, grasses, indoor molds, outdoor molds, dust mites and cockroach - Copy of test results provided.  - Avoidance measures provided. - Stop taking: Claritin - Continue with: Nasacort (triamcinolone) two sprays per nostril daily - Start taking: Allegra (fexofenadine) 180mg  table once daily, Singulair (montelukast) 10mg  daily and Astelin (azelastine) 2 sprays per nostril 1-2 times daily as needed - You can use an extra dose of the antihistamine, if needed, for breakthrough symptoms.  - Consider nasal saline rinses 1-2 times daily to remove allergens from the nasal cavities as well as help with mucous clearance (this is especially helpful to do before the nasal sprays are given) - Consider allergy shots as a means of long-term control. - Allergy shots "re-train" and "reset" the immune system to ignore environmental allergens and decrease the resulting immune response to those allergens (sneezing, itchy watery eyes, runny nose, nasal congestion, etc).    - Allergy shots improve symptoms in 75-85% of patients.  - We can discuss more at the next appointment if the medications are not working for you.  2. Return in about 6 weeks (around 04/03/2019).   Please inform us of any Emergency Department visits, hospitalizations, or changes in symptoms. Call us before going to the ED for breathing or allergy symptoms since we might be able to fit you in for a sick visit. Feel free to contact us anytime with any questions, problems, or concerns.  It was a pleasure to meet you today!  Websites that have reliable patient information: 1. American Academy of Asthma, Allergy, and Immunology: www.aaaai.org 2. Food Allergy Research and Education (FARE): foodallergy.org 3. Mothers of Asthmatics: http://www.asthmacommunitynetwork.org 4. American College of Allergy, Asthma, and Immunology: MissingWeapons.ca   Make sure you are registered to vote! If you have moved or  changed any of your contact information, you will need to get this updated before voting!    Voter ID laws are NOT going into effect for the General Election in November 2020! DO NOT let this stop you from exercising your right to vote!    Reducing Pollen Exposure  The American Academy of Allergy, Asthma and Immunology suggests the following steps to reduce your exposure to pollen during allergy seasons.    1. Do not hang sheets or clothing out to dry; pollen may collect on these items. 2. Do not mow lawns or spend time around freshly cut grass; mowing stirs up pollen. 3. Keep windows closed at night.  Keep car windows closed while driving. 4. Minimize morning activities outdoors, a time when pollen counts are usually at their highest. 5. Stay indoors as much as possible when pollen counts or humidity is high and on windy days when pollen tends to remain in the air longer. 6. Use air conditioning when possible.  Many air conditioners have filters that trap the pollen spores. 7. Use a HEPA room air filter to remove pollen form the indoor air you breathe.  Control of Mold Allergen   Mold and fungi can grow on a variety of surfaces provided certain temperature and moisture conditions exist.  Outdoor molds grow on plants, decaying vegetation and soil.  The major outdoor mold, Alternaria and Cladosporium, are found in very high numbers during hot and dry conditions.  Generally, a late Summer - Fall peak is seen for common outdoor fungal spores.  Rain will temporarily lower outdoor mold spore count, but counts rise rapidly when the rainy period ends.  The most important indoor molds  are Aspergillus and Penicillium.  Dark, humid and poorly ventilated basements are ideal sites for mold growth.  The next most common sites of mold growth are the bathroom and the kitchen.  Outdoor (Seasonal) Mold Control  Positive outdoor molds via skin testing: Alternaria, Cladosporium, Bipolaris (Helminthsporium),  Drechslera (Curvalaria) and Mucor  1. Use air conditioning and keep windows closed 2. Avoid exposure to decaying vegetation. 3. Avoid leaf raking. 4. Avoid grain handling. 5. Consider wearing a face mask if working in moldy areas.  6.   Indoor (Perennial) Mold Control   Positive indoor molds via skin testing: Aspergillus, Penicillium, Fusarium, Aureobasidium (Pullulara) and Rhizopus  1. Maintain humidity below 50%. 2. Clean washable surfaces with 5% bleach solution. 3. Remove sources e.g. contaminated carpets.     Control of House Dust Mite Allergen    House dust mites play a major role in allergic asthma and rhinitis.  They occur in environments with high humidity wherever human skin, the food for dust mites is found. High levels have been detected in dust obtained from mattresses, pillows, carpets, upholstered furniture, bed covers, clothes and soft toys.  The principal allergen of the house dust mite is found in its feces.  A gram of dust may contain 1,000 mites and 250,000 fecal particles.  Mite antigen is easily measured in the air during house cleaning activities.    1. Encase mattresses, including the box spring, and pillow, in an air tight cover.  Seal the zipper end of the encased mattresses with wide adhesive tape. 2. Wash the bedding in water of 130 degrees Farenheit weekly.  Avoid cotton comforters/quilts and flannel bedding: the most ideal bed covering is the dacron comforter. 3. Remove all upholstered furniture from the bedroom. 4. Remove carpets, carpet padding, rugs, and non-washable window drapes from the bedroom.  Wash drapes weekly or use plastic window coverings. 5. Remove all non-washable stuffed toys from the bedroom.  Wash stuffed toys weekly. 6. Have the room cleaned frequently with a vacuum cleaner and a damp dust-mop.  The patient should not be in a room which is being cleaned and should wait 1 hour after cleaning before going into the room. 7. Close and  seal all heating outlets in the bedroom.  Otherwise, the room will become filled with dust-laden air.  An electric heater can be used to heat the room. 8. Reduce indoor humidity to less than 50%.  Do not use a humidifier.  Control of Cockroach Allergen  Cockroach allergen has been identified as an important cause of acute attacks of asthma, especially in urban settings.  There are fifty-five species of cockroach that exist in the [place], however only three, the [place], [place] species produce allergen that can affect patients with Asthma.  Allergens can be obtained from fecal particles, egg casings and secretions from cockroaches.    1. Remove food sources. 2. Reduce access to water. 3. Seal access and entry points. 4. Spray runways with 0.5-1% Diazinon or Chlorpyrifos 5. Blow boric acid power under stoves and refrigerator. 6. Place bait stations (hydramethylnon) at feeding sites.  Allergy Shots   Allergies are the result of a chain reaction that starts in the immune system. Your immune system controls how your body defends itself. For instance, if you have an allergy to pollen, your immune system identifies pollen as an invader or allergen. Your immune system overreacts by producing antibodies called Immunoglobulin E (IgE). These antibodies travel to cells that release chemicals, causing an allergic reaction.  The concept behind allergy immunotherapy, whether it is received in the form of shots or tablets, is that the immune system can be desensitized to specific allergens that trigger allergy symptoms. Although it requires time and patience, the payback can be long-term relief.  How Do Allergy Shots Work?  Allergy shots work much like a vaccine. Your body responds to injected amounts of a particular allergen given in increasing doses, eventually developing a resistance and tolerance to it. Allergy shots can lead to decreased, minimal or no allergy symptoms.  There  generally are two phases: build-up and maintenance. Build-up often ranges from three to six months and involves receiving injections with increasing amounts of the allergens. The shots are typically given once or twice a week, though more rapid build-up schedules are sometimes used.  The maintenance phase begins when the most effective dose is reached. This dose is different for each person, depending on how allergic you are and your response to the build-up injections. Once the maintenance dose is reached, there are longer periods between injections, typically two to four weeks.  Occasionally doctors give cortisone-type shots that can temporarily reduce allergy symptoms. These types of shots are different and should not be confused with allergy immunotherapy shots.  Who Can Be Treated with Allergy Shots?  Allergy shots may be a good treatment approach for people with allergic rhinitis (hay fever), allergic asthma, conjunctivitis (eye allergy) or stinging insect allergy.   Before deciding to begin allergy shots, you should consider:  . The length of allergy season and the severity of your symptoms . Whether medications and/or changes to your environment can control your symptoms . Your desire to avoid long-term medication use . Time: allergy immunotherapy requires a major time commitment . Cost: may vary depending on your insurance coverage  Allergy shots for children age 29five and older are effective and often well tolerated. They might prevent the onset of new allergen sensitivities or the progression to asthma.  Allergy shots are not started on patients who are pregnant but can be continued on patients who become pregnant while receiving them. In some patients with other medical conditions or who take certain common medications, allergy shots may be of risk. It is important to mention other medications you talk to your allergist.   When Will I Feel Better?  Some may experience decreased  allergy symptoms during the build-up phase. For others, it may take as long as 12 months on the maintenance dose. If there is no improvement after a year of maintenance, your allergist will discuss other treatment options with you.  If you aren't responding to allergy shots, it may be because there is not enough dose of the allergen in your vaccine or there are missing allergens that were not identified during your allergy testing. Other reasons could be that there are high levels of the allergen in your environment or major exposure to non-allergic triggers like tobacco smoke.  What Is the Length of Treatment?  Once the maintenance dose is reached, allergy shots are generally continued for three to five years. The decision to stop should be discussed with your allergist at that time. Some people may experience a permanent reduction of allergy symptoms. Others may relapse and a longer course of allergy shots can be considered.  What Are the Possible Reactions?  The two types of adverse reactions that can occur with allergy shots are local and systemic. Common local reactions include very mild redness and swelling at the injection site, which can  happen immediately or several hours after. A systemic reaction, which is less common, affects the entire body or a particular body system. They are usually mild and typically respond quickly to medications. Signs include increased allergy symptoms such as sneezing, a stuffy nose or hives.  Rarely, a serious systemic reaction called anaphylaxis can develop. Symptoms include swelling in the throat, wheezing, a feeling of tightness in the chest, nausea or dizziness. Most serious systemic reactions develop within 30 minutes of allergy shots. This is why it is strongly recommended you wait in your doctor's office for 30 minutes after your injections. Your allergist is trained to watch for reactions, and his or her staff is trained and equipped with the proper  medications to identify and treat them.  Who Should Administer Allergy Shots?  The preferred location for receiving shots is your prescribing allergist's office. Injections can sometimes be given at another facility where the physician and staff are trained to recognize and treat reactions, and have received instructions by your prescribing allergist.

## 2019-02-20 NOTE — Progress Notes (Signed)
NEW PATIENT  Date of Service/Encounter:  02/20/19  Referring provider: Johny Blamer, MD   Assessment:   Seasonal and perennial allergic rhinitis (trees, weeds, grasses, indoor molds, outdoor molds, dust mites and cockroach)  Plan/Recommendations:   1. Chronic rhinitis - Testing today showed: trees, weeds, grasses, indoor molds, outdoor molds, dust mites and cockroach - Copy of test results provided.  - Avoidance measures provided. - Stop taking: Claritin - Continue with: Nasacort (triamcinolone) two sprays per nostril daily - Start taking: Allegra (fexofenadine) 180mg  table once daily, Singulair (montelukast) 10mg  daily and Astelin (azelastine) 2 sprays per nostril 1-2 times daily as needed - You can use an extra dose of the antihistamine, if needed, for breakthrough symptoms.  - Consider nasal saline rinses 1-2 times daily to remove allergens from the nasal cavities as well as help with mucous clearance (this is especially helpful to do before the nasal sprays are given) - Consider allergy shots as a means of long-term control. - Allergy shots "re-train" and "reset" the immune system to ignore environmental allergens and decrease the resulting immune response to those allergens (sneezing, itchy watery eyes, runny nose, nasal congestion, etc).    - Allergy shots improve symptoms in 75-85% of patients.  - We can discuss more at the next appointment if the medications are not working for you.  2. Return in about 6 weeks (around 04/03/2019).  Subjective:   Kristen Lopez is a 58 y.o. female presenting today for evaluation of  Chief Complaint  Patient presents with  . Nasal Congestion    pine trees, mold,   . Sinus Problem    on and off for over a year.     Kristen Lopez has a history of the following: Patient Active Problem List   Diagnosis Date Noted  . Seasonal and perennial allergic rhinitis 02/20/2019  . Dizziness and giddiness 07/24/2013  . Disturbance of  skin sensation 07/24/2013  . Cervicalgia 07/24/2013  . Personal history of diseases of blood and blood-forming organs 07/24/2013    History obtained from: chart review and patient.  Kristen Lopez was referred by Johny Blamer, MD.     Kristen Lopez is a 58 y.o. female presenting for an evaluation of chronic rhinitis.  Allergic Rhinitis Symptom History: She first noticed problems in 2018 when she brought in a Christmas tree and seemed to have problems with nasal congestion and sinus pressure. Typically she starts with sinus pressure and congestion. She tells me that it does not spread into the lungs, but at times there is just pressure on the bilateral maxillary sinuses. There is no fever at all. She has trie dusing Sudafed daily to see if this helps. Before she has the sinus infection, she was taking Sudafed nearly daily for 4-5 months. She thinks that it was consistent on and off, throughout the entirety of the year. She has used some nose sprays 6 months into the problem and it did help (fluticasone), but then someone recommended an allergy pill. She took the Sudafed one morning and then took an allergy tablet. Then it felt as if it "did something different". This seemed to make things worse overall.   Otherwise, there is no history of other atopic diseases, including asthma, food allergies, drug allergies, stinging insect allergies, eczema, urticaria or contact dermatitis. There is no significant infectious history. Vaccinations are up to date.    Past Medical History: Patient Active Problem List   Diagnosis Date Noted  . Seasonal and perennial allergic rhinitis  02/20/2019  . Dizziness and giddiness 07/24/2013  . Disturbance of skin sensation 07/24/2013  . Cervicalgia 07/24/2013  . Personal history of diseases of blood and blood-forming organs 07/24/2013    Medication List:  Allergies as of 02/20/2019      Reactions   Amoxicillin Other (See Comments)   "Just makes me feel bad"    Azithromycin Other (See Comments)   "Just makes me feel bad"   Erythromycin Other (See Comments)   No rash, "just makes me feel bad"      Medication List       Accurate as of February 20, 2019 12:20 PM. Always use your most recent med list.        azelastine 0.1 % nasal spray Commonly known as:  ASTELIN Place 2 sprays into both nostrils 2 (two) times daily.   cholecalciferol 25 MCG (1000 UT) tablet Commonly known as:  VITAMIN D Take 1,000 Units by mouth daily.   Elderberry 575 MG/5ML Syrp Take by mouth.   fluticasone 50 MCG/ACT nasal spray Commonly known as:  FLONASE Place 1 spray into both nostrils daily.   loratadine 10 MG tablet Commonly known as:  CLARITIN Take 1 tablet (10 mg total) by mouth daily.   MAGNESIUM CITRATE PO Take 1 tablet by mouth daily.   meclizine 12.5 MG tablet Commonly known as:  ANTIVERT Take 1 tablet (12.5 mg total) by mouth 3 (three) times daily as needed for dizziness.   Melatonin 5 MG Tabs Take 5 mg by mouth at bedtime as needed (sleep).   montelukast 10 MG tablet Commonly known as:  SINGULAIR Take 1 tablet (10 mg total) by mouth at bedtime.   ondansetron 4 MG tablet Commonly known as:  ZOFRAN Take 1 tablet (4 mg total) by mouth every 6 (six) hours.   OVER THE COUNTER MEDICATION Take 1 tablet by mouth daily. "Vitamin Code" B.HCL, Thymex, [employer], Thytrophin PMG, [place].   vitamin C 1000 MG tablet Take 1,000 mg by mouth daily.       Birth History: non-contributory  Developmental History: non-contributory.   Past Surgical History: Past Surgical History:  Procedure Laterality Date  . ABDOMINAL HYSTERECTOMY    . DILITATION & CURRETTAGE/HYSTROSCOPY WITH NOVASURE ABLATION  11/17/2012   Procedure: DILATATION & CURETTAGE/HYSTEROSCOPY WITH NOVASURE ABLATION;  Surgeon: Meriel Pica, MD;  Location: [facility];  Service: Gynecology;  Laterality: N/A;  Attempted and failed  . DILITATION & CURRETTAGE/HYSTROSCOPY WITH THERMACHOICE  ABLATION  11/17/2012   Procedure: DILATATION & CURETTAGE/HYSTEROSCOPY WITH THERMACHOICE ABLATION;  Surgeon: Meriel Pica, MD;  Location: [facility];  Service: Gynecology;  Laterality: N/A;  . EXTERNAL EAR SURGERY    . LAPAROSCOPIC ASSISTED VAGINAL HYSTERECTOMY  11/22/2012   Procedure: LAPAROSCOPIC ASSISTED VAGINAL HYSTERECTOMY;  Surgeon: Meriel Pica, MD;  Location: [facility];  Service: Gynecology;  Laterality: N/A;     Family History: Family History  Problem Relation Age of Onset  . Other Father 8       Car accident  . Alcohol abuse Mother 63  . Diabetes Mother   . Hypertension Mother   . Arthritis Mother   . Heart failure Maternal Grandmother   . Hypertension Paternal Grandmother   . ALS Paternal Grandfather      Social History: Satara lives at home with her family.  She lives in a house that is over 75 years old.  There are hardwoods throughout the home.  They have electric heating and central cooling.  There are no indoor animals,  but there is a stray cat occasionally.  She does not have dust mite coverings on the bedding.  There is no tobacco exposure.  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    Review of Systems  Constitutional: Negative.  Negative for fever, malaise/fatigue and weight loss.  HENT: Positive for congestion, sinus pain and sore throat. Negative for ear discharge and ear pain.   Eyes: Negative for pain, discharge and redness.  Respiratory: Negative for cough, sputum production, shortness of breath and wheezing.   Cardiovascular: Negative.  Negative for chest pain and palpitations.  Gastrointestinal: Negative for abdominal pain and heartburn.  Skin: Negative.  Negative for itching and rash.  Neurological: Negative for dizziness and headaches.  Endo/Heme/Allergies: Negative for environmental allergies. Does not bruise/bleed easily.       Objective:   Blood pressure 120/86, pulse 79, resp. rate 16, height 5' 3.5" (1.613 m),  weight 163 lb 6.4 oz (74.1 kg), last menstrual period 08/01/2012, SpO2 97 %. Body mass index is 28.49 kg/m.   Physical Exam:   Physical Exam  Constitutional: She appears well-developed.  HENT:  Head: Normocephalic and atraumatic.  Right Ear: Tympanic membrane, external ear and ear canal normal.  Left Ear: Tympanic membrane and ear canal normal.  Nose: Mucosal edema and rhinorrhea present. No nasal deformity or septal deviation. No epistaxis. Right sinus exhibits no maxillary sinus tenderness and no frontal sinus tenderness. Left sinus exhibits no maxillary sinus tenderness and no frontal sinus tenderness.  Mouth/Throat: Uvula is midline and oropharynx is clear and moist. Mucous membranes are not pale and not dry.  Marked nasal congestion with turbinate hypertrophy. There is clear mucous in the bilateral nares.  Eyes: Pupils are equal, round, and reactive to light. Conjunctivae and EOM are normal. Right eye exhibits no chemosis and no discharge. Left eye exhibits no chemosis and no discharge. Right conjunctiva is not injected. Left conjunctiva is not injected.  Cardiovascular: Normal rate, regular rhythm and normal heart sounds.  Respiratory: Effort normal and breath sounds normal. No accessory muscle usage. No tachypnea. No respiratory distress. She has no wheezes. She has no rhonchi. She has no rales. She exhibits no tenderness.  Lymphadenopathy:    She has no cervical adenopathy.  Neurological: She is alert.  Skin: No abrasion, no petechiae and no rash noted. Rash is not papular, not vesicular and not urticarial. No erythema. No pallor.  Psychiatric: She has a normal mood and affect.     Diagnostic studies:    Allergy Studies:    Airborne Adult Perc - 02/20/19 1009    Time Antigen Placed  1010    Allergen Manufacturer  Waynette Buttery    Location  Back    Number of Test  59    Panel 1  Select    1. Control-Buffer 50% Glycerol  Negative    2. Control-Histamine 1 mg/ml  2+    3. Albumin  saline  Negative    4. Bahia  Negative    5. [place]  Negative    6. Johnson  Negative    7. [place] Blue  Negative    8. Meadow Fescue  Negative    9. Perennial Rye  Negative    10. Sweet Vernal  Negative    11. Timothy  Negative    12. Cocklebur  Negative    13. Burweed Marshelder  Negative    14. Ragweed, short  Negative    15. Ragweed, Giant  Negative  16. Plantain,  English  2+    17. Lamb's Quarters  2+    18. Sheep Sorrell  2+    19. Rough Pigweed  2+    20. Marsh Elder, Rough  Negative    21. Mugwort, Common  Negative    22. Ash mix  Negative    23. Birch mix  Negative    24. Beech American  Negative    25. Box, Elder  Negative    26. Cedar, red  Negative    27. Cottonwood, [place]  Negative    28. Elm mix  Negative    29. Hickory mix  Negative    30. Maple mix  Negative    31. Oak, [place] mix  Negative    32. Pecan Pollen  Negative    33. Pine mix  Negative    34. Sycamore Eastern  Negative    35. Walnut, Black Pollen  Negative    36. Alternaria alternata  2+    37. Cladosporium Herbarum  2+    38. Aspergillus mix  Negative    39. Penicillium mix  Negative    40. Bipolaris sorokiniana (Helminthosporium)  Negative    41. Drechslera spicifera (Curvularia)  Negative    42. Mucor plumbeus  Negative    43. Fusarium moniliforme  Negative    44. Aureobasidium pullulans (pullulara)  Negative    45. Rhizopus oryzae  Negative    46. Botrytis cinera  Negative    47. Epicoccum nigrum  Negative    48. Phoma betae  Negative    49. Candida Albicans  Negative    50. Trichophyton mentagrophytes  Negative    51. Mite, D Farinae  5,000 AU/ml  Negative    52. Mite, D Pteronyssinus  5,000 AU/ml  Negative    53. Cat Hair 10,000 BAU/ml  Negative    54.  Dog Epithelia  Negative    55. Mixed Feathers  Negative    56. Horse Epithelia  Negative    57. Cockroach, German  Negative    58. Mouse  Negative    59. Tobacco Leaf  Negative     Intradermal - 02/20/19 1052    Time  Antigen Placed  1050    Allergen Manufacturer  Waynette Buttery    Location  Arm    Number of Test  13    Intradermal  Select    Control  Negative    [place]  Negative    Johnson  Negative    7 Grass  2+    Ragweed mix  1+    Tree mix  1+    Mold 2  2+    Mold 3  2+    Mold 4  3+    Cat  Negative    Dog  Negative    Cockroach  2+    Mite mix  2+       Allergy testing results were read and interpreted by myself, documented by clinical staff.         Malachi Bonds, MD [facility]

## 2019-03-27 ENCOUNTER — Ambulatory Visit: Payer: 59 | Admitting: Allergy & Immunology

## 2019-05-23 ENCOUNTER — Encounter (HOSPITAL_BASED_OUTPATIENT_CLINIC_OR_DEPARTMENT_OTHER): Payer: Self-pay

## 2019-05-23 ENCOUNTER — Ambulatory Visit (HOSPITAL_BASED_OUTPATIENT_CLINIC_OR_DEPARTMENT_OTHER): Admit: 2019-05-23 | Payer: 59 | Admitting: Obstetrics and Gynecology

## 2019-05-23 SURGERY — COLPORRHAPHY, ANTERIOR, FOR CYSTOCELE REPAIR
Anesthesia: Choice

## 2019-07-03 NOTE — H&P (Signed)
NAME: Kristen Lopez, MAMONE MEDICAL RECORD [id number] ACCOUNT [id number] DATE OF BIRTH:1961-01-18 FACILITY: [facility] LOCATION:  PHYSICIAN:Ayven Pheasant Garry Heater, MD  HISTORY AND PHYSICAL  DATE OF ADMISSION:  07/25/2019  CHIEF COMPLAINT:  Symptomatic cystocele.  HISTORY OF PRESENT ILLNESS:  A 58 year old G3 P3 LC3 who had previous LAVH in 2013 who has had complaints of vaginal protrusion and some discomfort with sex.  Examination in the office showed good posterior support.  Her cuff support looked reasonably  normal, but she does have a moderate cystocele.  She had urodynamics performed that did not show significant leakage with a full bladder and presents at this time for outpatient cystocele repair.  ALLERGIES:  PENICILLIN.  FAMILY HISTORY:  Significant for maternal grandmother with heart disease and also hypertension, paternal grandmother also had diabetes.  SOCIAL HISTORY:  She is married.  She is a former smoker.  MEDICATIONS:  She is currently on Astelin nasal spray, Estrace vaginal cream, Claritin and Sudafed over the counter.  PHYSICAL EXAMINATION: VITAL SIGNS:  Temperature 98.2, blood pressure 120/82. HEENT:  Unremarkable. NECK:  Supple, without masses. LUNGS:  Clear. CARDIOVASCULAR:  Regular rate and rhythm without murmurs, rubs or gallops. BREASTS:  Without masses, tenderness, or nipple discharge. ABDOMEN:  Soft, flat, nontender. PELVIC:  Vulva is unremarkable.  On straining, a mild cystocele noted.  Posterior support and cuff support looked reasonably normal.  Bimanual otherwise negative. EXTREMITIES:  Unremarkable. NEUROLOGIC:  Unremarkable.  IMPRESSION:  Symptomatic cystocele.  PLAN:  Anterior colporrhaphy.  Procedure and risks reviewed which include bleeding, infection, adjacent organ injury that may require additional surgery.  LN/NUANCE  D:07/03/2019 T:07/03/2019 JOB:[id number]

## 2019-07-21 ENCOUNTER — Other Ambulatory Visit (HOSPITAL_COMMUNITY)
Admission: RE | Admit: 2019-07-21 | Discharge: 2019-07-21 | Disposition: A | Discharge status: Home / Self Care | Payer: 59 | Source: Ambulatory Visit | Attending: Obstetrics and Gynecology | Admitting: Obstetrics and Gynecology

## 2019-07-21 DIAGNOSIS — Z1159 Encounter for screening for other viral diseases: Secondary | ICD-10-CM | POA: Insufficient documentation

## 2019-07-21 LAB — SARS CORONAVIRUS 2 (TAT 6-24 HRS): SARS Coronavirus 2: NEGATIVE

## 2019-07-23 ENCOUNTER — Encounter (HOSPITAL_BASED_OUTPATIENT_CLINIC_OR_DEPARTMENT_OTHER): Payer: Self-pay | Admitting: *Deleted

## 2019-07-23 ENCOUNTER — Other Ambulatory Visit: Payer: Self-pay

## 2019-07-23 NOTE — Progress Notes (Signed)
Spoke with patient via telephone for pre op interview. NPO after MN. RCC guidelines discussed with patient and she verbalized understanding. No medications AM of surgery. Arrival time 0530.

## 2019-07-24 ENCOUNTER — Encounter (HOSPITAL_BASED_OUTPATIENT_CLINIC_OR_DEPARTMENT_OTHER): Payer: Self-pay | Admitting: *Deleted

## 2019-07-25 ENCOUNTER — Encounter (HOSPITAL_BASED_OUTPATIENT_CLINIC_OR_DEPARTMENT_OTHER)
Admission: RE | Disposition: A | Discharge status: Home / Self Care | Payer: Self-pay | Source: Home / Self Care | Attending: Obstetrics and Gynecology

## 2019-07-25 ENCOUNTER — Other Ambulatory Visit: Payer: Self-pay

## 2019-07-25 ENCOUNTER — Ambulatory Visit (HOSPITAL_BASED_OUTPATIENT_CLINIC_OR_DEPARTMENT_OTHER): Payer: 59 | Admitting: Anesthesiology

## 2019-07-25 ENCOUNTER — Encounter (HOSPITAL_BASED_OUTPATIENT_CLINIC_OR_DEPARTMENT_OTHER): Payer: Self-pay

## 2019-07-25 ENCOUNTER — Ambulatory Visit (HOSPITAL_BASED_OUTPATIENT_CLINIC_OR_DEPARTMENT_OTHER)
Admission: RE | Admit: 2019-07-25 | Discharge: 2019-07-25 | Disposition: A | Discharge status: Home / Self Care | Payer: 59 | Attending: Obstetrics and Gynecology | Admitting: Obstetrics and Gynecology

## 2019-07-25 DIAGNOSIS — Z87891 Personal history of nicotine dependence: Secondary | ICD-10-CM | POA: Diagnosis not present

## 2019-07-25 DIAGNOSIS — N8110 Cystocele, unspecified: Secondary | ICD-10-CM | POA: Insufficient documentation

## 2019-07-25 DIAGNOSIS — Z79890 Hormone replacement therapy (postmenopausal): Secondary | ICD-10-CM | POA: Insufficient documentation

## 2019-07-25 DIAGNOSIS — Z515 Encounter for palliative care: Secondary | ICD-10-CM

## 2019-07-25 HISTORY — PX: CYSTOCELE REPAIR: SHX163

## 2019-07-25 SURGERY — COLPORRHAPHY, ANTERIOR, FOR CYSTOCELE REPAIR
Anesthesia: General

## 2019-07-25 MED ORDER — LIDOCAINE-EPINEPHRINE 1 %-1:100000 IJ SOLN
INTRAMUSCULAR | Status: DC | PRN
  Administered 2019-07-25: 8 mL

## 2019-07-25 MED ORDER — FENTANYL CITRATE (PF) 100 MCG/2ML IJ SOLN
INTRAMUSCULAR | Status: AC
  Filled 2019-07-25: qty 2

## 2019-07-25 MED ORDER — DEXAMETHASONE SODIUM PHOSPHATE 10 MG/ML IJ SOLN
INTRAMUSCULAR | Status: DC | PRN
  Administered 2019-07-25: 5 mg via INTRAVENOUS

## 2019-07-25 MED ORDER — FENTANYL CITRATE (PF) 100 MCG/2ML IJ SOLN
25.0000 ug | INTRAMUSCULAR | Status: DC | PRN
  Filled 2019-07-25: qty 1

## 2019-07-25 MED ORDER — KETOROLAC TROMETHAMINE 30 MG/ML IJ SOLN
INTRAMUSCULAR | Status: AC
  Filled 2019-07-25: qty 1

## 2019-07-25 MED ORDER — LIDOCAINE 2% (20 MG/ML) 5 ML SYRINGE
INTRAMUSCULAR | Status: AC
  Filled 2019-07-25: qty 5

## 2019-07-25 MED ORDER — MIDAZOLAM HCL 2 MG/2ML IJ SOLN
INTRAMUSCULAR | Status: DC | PRN
  Administered 2019-07-25: 2 mg via INTRAVENOUS

## 2019-07-25 MED ORDER — LACTATED RINGERS IV SOLN
INTRAVENOUS | Status: DC
  Administered 2019-07-25: 50 mL/h via INTRAVENOUS
  Administered 2019-07-25: 10:00:00 via INTRAVENOUS
  Filled 2019-07-25: qty 1000

## 2019-07-25 MED ORDER — MIDAZOLAM HCL 2 MG/2ML IJ SOLN
INTRAMUSCULAR | Status: AC
  Filled 2019-07-25: qty 2

## 2019-07-25 MED ORDER — OXYCODONE-ACETAMINOPHEN 5-325 MG PO TABS: 1.0000 | ORAL_TABLET | Freq: Four times a day (QID) | ORAL | 0 refills | Status: DC | PRN

## 2019-07-25 MED ORDER — LIDOCAINE-EPINEPHRINE 1 %-1:100000 IJ SOLN
INTRAMUSCULAR | Status: AC
  Filled 2019-07-25: qty 1

## 2019-07-25 MED ORDER — ACETAMINOPHEN 500 MG PO TABS
1000.0000 mg | ORAL_TABLET | Freq: Once | ORAL | Status: AC
  Administered 2019-07-25: 1000 mg via ORAL
  Filled 2019-07-25: qty 2

## 2019-07-25 MED ORDER — PROPOFOL 10 MG/ML IV BOLUS
INTRAVENOUS | Status: DC | PRN
  Administered 2019-07-25: 150 mg via INTRAVENOUS

## 2019-07-25 MED ORDER — ACETAMINOPHEN 500 MG PO TABS
ORAL_TABLET | ORAL | Status: AC
  Filled 2019-07-25: qty 2

## 2019-07-25 MED ORDER — PROPOFOL 10 MG/ML IV BOLUS
INTRAVENOUS | Status: AC
  Filled 2019-07-25: qty 20

## 2019-07-25 MED ORDER — FENTANYL CITRATE (PF) 100 MCG/2ML IJ SOLN
INTRAMUSCULAR | Status: DC | PRN
  Administered 2019-07-25 (×2): 25 ug via INTRAVENOUS
  Administered 2019-07-25: 50 ug via INTRAVENOUS

## 2019-07-25 MED ORDER — IBUPROFEN 200 MG PO TABS: 800.0000 mg | ORAL_TABLET | Freq: Three times a day (TID) | ORAL | 1 refills | Status: AC | PRN

## 2019-07-25 MED ORDER — LIDOCAINE 2% (20 MG/ML) 5 ML SYRINGE
INTRAMUSCULAR | Status: DC | PRN
  Administered 2019-07-25: 100 mg via INTRAVENOUS

## 2019-07-25 MED ORDER — STERILE WATER FOR IRRIGATION IR SOLN
Status: DC | PRN
  Administered 2019-07-25: 1000 mL

## 2019-07-25 MED ORDER — ONDANSETRON HCL 4 MG/2ML IJ SOLN
INTRAMUSCULAR | Status: AC
  Filled 2019-07-25: qty 2

## 2019-07-25 MED ORDER — DEXAMETHASONE SODIUM PHOSPHATE 10 MG/ML IJ SOLN
INTRAMUSCULAR | Status: AC
  Filled 2019-07-25: qty 1

## 2019-07-25 SURGICAL SUPPLY — 26 items
CANISTER SUCT 3000ML PPV (MISCELLANEOUS) ×2 IMPLANT
COVER WAND RF STERILE (DRAPES) ×2 IMPLANT
DECANTER SPIKE VIAL GLASS SM (MISCELLANEOUS) IMPLANT
GAUZE PACKING 1 X5 YD ST (GAUZE/BANDAGES/DRESSINGS) IMPLANT
GLOVE BIO SURGEON STRL SZ7 (GLOVE) ×2 IMPLANT
GLOVE BIOGEL PI IND STRL 7.0 (GLOVE) ×1 IMPLANT
GLOVE BIOGEL PI INDICATOR 7.0 (GLOVE) ×1
GOWN STRL REUS W/TWL LRG LVL3 (GOWN DISPOSABLE) ×8 IMPLANT
NDL SPNL 22GX3.5 QUINCKE BK (NEEDLE) IMPLANT
NEEDLE HYPO 22GX1.5 SAFETY (NEEDLE) IMPLANT
NEEDLE SPNL 22GX3.5 QUINCKE BK (NEEDLE) IMPLANT
NS IRRIG 500ML POUR BTL (IV SOLUTION) ×2 IMPLANT
PACK VAGINAL WOMENS (CUSTOM PROCEDURE TRAY) ×2 IMPLANT
SET IRRIG Y TYPE TUR BLADDER L (SET/KITS/TRAYS/PACK) IMPLANT
SUT CHROMIC 2 0 CT 1 (SUTURE) IMPLANT
SUT SILK 2 0 FSL 18 (SUTURE) IMPLANT
SUT VIC AB 2-0 CT1 (SUTURE) IMPLANT
SUT VIC AB 2-0 CT1 27 (SUTURE)
SUT VIC AB 2-0 CT1 TAPERPNT 27 (SUTURE) IMPLANT
SUT VIC AB 2-0 SH 27 (SUTURE)
SUT VIC AB 2-0 SH 27XBRD (SUTURE) IMPLANT
SUT VIC AB 2-0 UR6 27 (SUTURE) ×8 IMPLANT
SUT VICRYL RAPIDE 3-0 36IN (SUTURE) ×4 IMPLANT
TOWEL OR 17X26 10 PK STRL BLUE (TOWEL DISPOSABLE) ×4 IMPLANT
TRAY FOLEY W/BAG SLVR 14FR (SET/KITS/TRAYS/PACK) ×2 IMPLANT
WATER STERILE IRR 500ML POUR (IV SOLUTION) ×2 IMPLANT

## 2019-07-25 NOTE — Op Note (Signed)
Preop diagnosis: Symptomatic cystocele  Postoperative diagnosis: Same  Procedure: Anterior repair  Surgeon: Matthew Saras  Assistant: Julien Girt  EBL: 10 cc  Procedure and findings:  Patient was taken to the operating room after an adequate level of general anesthesia was obtained with the legs in stirrups the perineum and vagina were prepped and draped and a Foley catheter was position.  Appropriate timeouts were taken at that point.  Exam under anesthesia revealed as per office findings the outlet support posterior wall support with good cuff support looked good she did have a moderate cystocele.  Diluted Xylocaine with epi was injected into the midline, a midline incision was then made from the urethral angle approximately two thirds of the way to the cuff.  This was placed on traction with Allis clamps, the bladder was dissected free with sharp and blunt dissection reducing the cystocele.  Once this was accomplished, the perivesical fascia was plicated in the midline with 2-0 Vicryl interrupted sutures.  A small amount of vaginal mucosa was trimmed and then the mucosa was reapproximated with 2-0 Vicryl interrupted sutures in the midline.  This was hemostatic packing was not used.  Clear urine noted at the end of the case.  She tolerated this well, went to recovery room in good condition.  Dictated with Dragon Medical 1  Margarette Asal MD

## 2019-07-25 NOTE — Anesthesia Procedure Notes (Signed)
Procedure Name: LMA Insertion Date/Time: 07/25/2019 7:26 AM Performed by: Suan Halter, CRNA Pre-anesthesia Checklist: Patient identified, Emergency Drugs available, Suction available and Patient being monitored Patient Re-evaluated:Patient Re-evaluated prior to induction Oxygen Delivery Method: Circle system utilized Preoxygenation: Pre-oxygenation with 100% oxygen Induction Type: IV induction Ventilation: Mask ventilation without difficulty LMA: LMA inserted LMA Size: 4.0 Number of attempts: 1 Airway Equipment and Method: Bite block Placement Confirmation: positive ETCO2 Tube secured with: Tape Dental Injury: Teeth and Oropharynx as per pre-operative assessment

## 2019-07-25 NOTE — Progress Notes (Signed)
The patient was re-examined with no change in status 

## 2019-07-25 NOTE — Transfer of Care (Signed)
Immediate Anesthesia Transfer of Care Note  Patient: Kristen Lopez  Procedure(s) Performed: Procedure(s) (LRB): ANTERIOR REPAIR (CYSTOCELE) (N/A)  Patient Location: PACU  Anesthesia Type: General  Level of Consciousness: awake, oriented, sedated and patient cooperative  Airway & Oxygen Therapy: Patient Spontanous Breathing and Patient connected to face mask oxygen  Post-op Assessment: Report given to PACU RN and Post -op Vital signs reviewed and stable  Post vital signs: Reviewed and stable  Complications: No apparent anesthesia complications  Last Vitals:  Vitals Value Taken Time  BP    Temp    Pulse 92 07/25/19 0806  Resp 20 07/25/19 0806  SpO2 96 % 07/25/19 0806  Vitals shown include unvalidated device data.  Last Pain:  Vitals:   07/25/19 0645  TempSrc:   PainSc: 5       Patients Stated Pain Goal: 5 (07/25/19 0645)

## 2019-07-25 NOTE — Anesthesia Preprocedure Evaluation (Addendum)
Anesthesia Evaluation  Patient identified by MRN, date of birth, ID band Patient awake    Reviewed: Allergy & Precautions, NPO status , Patient's Chart, lab work & pertinent test results  Airway Mallampati: II  TM Distance: >3 FB Neck ROM: Full    Dental no notable dental hx. (+) Teeth Intact, Dental Advisory Given   Pulmonary neg pulmonary ROS,    Pulmonary exam normal breath sounds clear to auscultation       Cardiovascular Normal cardiovascular exam+ Valvular Problems/Murmurs MVP  Rhythm:Regular Rate:Normal  TTE 2011 Unremarkable  Stress Echo 2011 normal   Neuro/Psych negative neurological ROS  negative psych ROS   GI/Hepatic negative GI ROS, Neg liver ROS,   Endo/Other  negative endocrine ROS  Renal/GU negative Renal ROS  negative genitourinary   Musculoskeletal negative musculoskeletal ROS (+)   Abdominal   Peds  Hematology negative hematology ROS (+)   Anesthesia Other Findings   Reproductive/Obstetrics negative OB ROS                            Anesthesia Physical Anesthesia Plan  ASA: II  Anesthesia Plan: General   Post-op Pain Management:    Induction: Intravenous  PONV Risk Score and Plan: 3 and Midazolam, Dexamethasone and Ondansetron  Airway Management Planned: LMA  Additional Equipment:   Intra-op Plan:   Post-operative Plan: Extubation in OR  Informed Consent: I have reviewed the patients History and Physical, chart, labs and discussed the procedure including the risks, benefits and alternatives for the proposed anesthesia with the patient or authorized representative who has indicated his/her understanding and acceptance.     Dental advisory given  Plan Discussed with: CRNA  Anesthesia Plan Comments:         Anesthesia Quick Evaluation

## 2019-07-25 NOTE — Anesthesia Postprocedure Evaluation (Signed)
Anesthesia Post Note  Patient: Kristen Lopez  Procedure(s) Performed: ANTERIOR REPAIR (CYSTOCELE) (N/A )     Patient location during evaluation: PACU Anesthesia Type: General Level of consciousness: awake and alert Pain management: pain level controlled Vital Signs Assessment: post-procedure vital signs reviewed and stable Respiratory status: spontaneous breathing, nonlabored ventilation, respiratory function stable and patient connected to nasal cannula oxygen Cardiovascular status: blood pressure returned to baseline and stable Postop Assessment: no apparent nausea or vomiting Anesthetic complications: no    Last Vitals:  Vitals:   07/25/19 0915 07/25/19 1023  BP: (!) 141/95 124/84  Pulse: 66 69  Resp: 17 16  Temp:  36.6 C  SpO2: 96% 100%    Last Pain:  Vitals:   07/25/19 0945  TempSrc:   PainSc: 0-No pain                 Denijah Karrer L Mayes Sangiovanni

## 2019-07-25 NOTE — Discharge Instructions (Signed)
Warm H20 sitz BID only as needed for comfort No sex or heavy exercise X 4 weeks May drive 1 week Normal walking, stairs, light work around house [place] Instructions  Activity: Get plenty of rest for the remainder of the day. A responsible individual must stay with you for 24 hours following the procedure.  For the next 24 hours, DO NOT: -Drive a car -Paediatric nurse -Drink alcoholic beverages -Take any medication unless instructed by your physician -Make any legal decisions or sign important papers.  Meals: Start with liquid foods such as gelatin or soup. Progress to regular foods as tolerated. Avoid greasy, spicy, heavy foods. If nausea and/or vomiting occur, drink only clear liquids until the nausea and/or vomiting subsides. Call your physician if vomiting continues.  Special Instructions/Symptoms: Your throat may feel dry or sore from the anesthesia or the breathing tube placed in your throat during surgery. If this causes discomfort, gargle with warm salt water. The discomfort should disappear within 24 hours.  If you had a scopolamine patch placed behind your ear for the management of post- operative nausea and/or vomiting:  1. The medication in the patch is effective for 72 hours, after which it should be removed.  Wrap patch in a tissue and discard in the trash. Wash hands thoroughly with soap and water. 2. You may remove the patch earlier than 72 hours if you experience unpleasant side effects which may include dry mouth, dizziness or visual disturbances. 3. Avoid touching the patch. Wash your hands with soap and water after contact with the patch.

## 2019-07-26 ENCOUNTER — Encounter (HOSPITAL_BASED_OUTPATIENT_CLINIC_OR_DEPARTMENT_OTHER): Payer: Self-pay | Admitting: Obstetrics and Gynecology

## 2019-11-30 ENCOUNTER — Other Ambulatory Visit: Payer: Self-pay | Admitting: Family Medicine

## 2019-11-30 DIAGNOSIS — Z1231 Encounter for screening mammogram for malignant neoplasm of breast: Secondary | ICD-10-CM

## 2020-04-13 ENCOUNTER — Encounter: Payer: Self-pay | Admitting: Emergency Medicine

## 2020-04-13 ENCOUNTER — Other Ambulatory Visit: Payer: Self-pay

## 2020-04-13 ENCOUNTER — Ambulatory Visit
Admission: EM | Admit: 2020-04-13 | Discharge: 2020-04-13 | Disposition: A | Discharge status: Home / Self Care | Payer: 59 | Attending: Physician Assistant | Admitting: Physician Assistant

## 2020-04-13 DIAGNOSIS — J3489 Other specified disorders of nose and nasal sinuses: Secondary | ICD-10-CM | POA: Diagnosis not present

## 2020-04-13 DIAGNOSIS — Z20822 Contact with and (suspected) exposure to covid-19: Secondary | ICD-10-CM

## 2020-04-13 MED ORDER — AZELASTINE HCL 0.1 % NA SOLN: 2.0000 | Freq: Two times a day (BID) | NASAL | 0 refills | Status: DC

## 2020-04-13 MED ORDER — FLUTICASONE PROPIONATE 50 MCG/ACT NA SUSP: 2.0000 | Freq: Every day | NASAL | 0 refills | Status: DC

## 2020-04-13 NOTE — ED Provider Notes (Signed)
[facility]    CSN: [id number] Arrival date & time: 04/13/20  1149      History   Chief Complaint Chief Complaint  Patient presents with  . Facial Pain  . Headache    HPI Kristen Lopez is a 59 y.o. female.   59 year old female comes in for few day of URI symptoms woke up with worsening symptoms. Now with body aches, sinus pressure, throat irritation, right ear pain, fatigue. No cough. Denies fever, chills. Loose stools. Denies abdominal pain, nausea, vomiting. Denies shortness of breath, loss of taste/smell. Lives with positive COVID husband. Have not taken anything for the symptoms. Never smoker.      Past Medical History:  Diagnosis Date  . Anemia 09/2012  . Low iron   . MVP (mitral valve prolapse)   . Palpitations    1980  . Shortness of breath    with anemia    Patient Active Problem List   Diagnosis Date Noted  . Hospice care patient 07/25/2019  . Seasonal and perennial allergic rhinitis 02/20/2019  . Dizziness and giddiness 07/24/2013  . Disturbance of skin sensation 07/24/2013  . Cervicalgia 07/24/2013  . Personal history of diseases of blood and blood-forming organs 07/24/2013    Past Surgical History:  Procedure Laterality Date  . CYSTOCELE REPAIR N/A 07/25/2019   Procedure: ANTERIOR REPAIR (CYSTOCELE);  Surgeon: Richarda Overlie, MD;  [facility];  Service: Gynecology;  Laterality: N/A;  need bed  . DILITATION & CURRETTAGE/HYSTROSCOPY WITH NOVASURE ABLATION  11/17/2012   Procedure: DILATATION & CURETTAGE/HYSTEROSCOPY WITH NOVASURE ABLATION;  Surgeon: Meriel Pica, MD;  Location: [facility];  Service: Gynecology;  Laterality: N/A;  Attempted and failed  . DILITATION & CURRETTAGE/HYSTROSCOPY WITH THERMACHOICE ABLATION  11/17/2012   Procedure: DILATATION & CURETTAGE/HYSTEROSCOPY WITH THERMACHOICE ABLATION;  Surgeon: Meriel Pica, MD;  Location: [facility];  Service: Gynecology;  Laterality: N/A;  . EXTERNAL EAR  SURGERY    . LAPAROSCOPIC ASSISTED VAGINAL HYSTERECTOMY  11/22/2012   Procedure: LAPAROSCOPIC ASSISTED VAGINAL HYSTERECTOMY;  Surgeon: Meriel Pica, MD;  Location: [facility];  Service: Gynecology;  Laterality: N/A;    OB History    Gravida  3   Para  3   Term  3   Preterm      AB      Living  3     SAB      TAB      Ectopic      Multiple      Live Births               Home Medications    Prior to Admission medications   Medication Sig Start Date End Date Taking? Authorizing Provider  Ascorbic Acid (VITAMIN C) 1000 MG tablet Take 1,000 mg by mouth daily.    [provider]  azelastine (ASTELIN) 0.1 % nasal spray Place 2 sprays into both nostrils 2 (two) times daily. 04/13/20   Cathie Hoops, Jentry Warnell V, PA-C  cholecalciferol (VITAMIN D) 1000 units tablet Take 1,000 Units by mouth daily.     [provider]  Elderberry 575 MG/5ML SYRP Take by mouth.    [provider]  fluticasone (FLONASE) 50 MCG/ACT nasal spray Place 2 sprays into both nostrils daily. 04/13/20   Cathie Hoops, Deletha Jaffee V, PA-C  ibuprofen (ADVIL) 200 MG tablet Take 4 tablets (800 mg total) by mouth every 8 (eight) hours as needed for moderate pain. 07/25/19   Richarda Overlie, MD  MAGNESIUM  CITRATE PO Take 1 tablet by mouth daily.    [provider]  Melatonin 5 MG TABS Take 5 mg by mouth at bedtime as needed (sleep).    [provider]    Family History Family History  Problem Relation Age of Onset  . Other Father 49       Car accident  . Alcohol abuse Mother 35  . Diabetes Mother   . Hypertension Mother   . Arthritis Mother   . Heart failure Maternal Grandmother   . Hypertension Paternal Grandmother   . ALS Paternal Grandfather     Social History Social History   Tobacco Use  . Smoking status: Never Smoker  . Smokeless tobacco: Never Used  Substance Use Topics  . Alcohol use: Yes    Alcohol/week: 1.0 standard drinks    Types: 1 Glasses of wine per week    Comment:  socially  . Drug use: No     Allergies   Amoxicillin, Azithromycin, and Erythromycin   Review of Systems Review of Systems  Reason unable to perform ROS: See HPI as above.     Physical Exam Triage Vital Signs ED Triage Vitals  Enc Vitals Group     BP 04/13/20 1218 135/85     Pulse Rate 04/13/20 1218 69     Resp 04/13/20 1218 16     Temp 04/13/20 1218 98 F (36.7 C)     Temp Source 04/13/20 1218 Oral     SpO2 04/13/20 1218 94 %     Weight --      Height --      Head Circumference --      Peak Flow --      Pain Score 04/13/20 1230 1     Pain Loc --      Pain Edu? --      Excl. in GC? --    No data found.  Updated Vital Signs BP 135/85 (BP Location: Left Arm)   Pulse 69   Temp 98 F (36.7 C) (Oral)   Resp 16   LMP 08/01/2012   SpO2 94%   Physical Exam Constitutional:      General: She is not in acute distress.    Appearance: Normal appearance. She is not ill-appearing, toxic-appearing or diaphoretic.  HENT:     Head: Normocephalic and atraumatic.     Right Ear: Tympanic membrane, ear canal and external ear normal. Tympanic membrane is not erythematous or bulging.     Left Ear: Tympanic membrane, ear canal and external ear normal. Tympanic membrane is not erythematous or bulging.     Nose:     Right Sinus: Maxillary sinus tenderness and frontal sinus tenderness present.     Left Sinus: Maxillary sinus tenderness and frontal sinus tenderness present.     Mouth/Throat:     Mouth: Mucous membranes are moist.     Pharynx: Oropharynx is clear. Uvula midline.  Cardiovascular:     Rate and Rhythm: Normal rate and regular rhythm.     Heart sounds: Normal heart sounds. No murmur. No friction rub. No gallop.   Pulmonary:     Effort: Pulmonary effort is normal. No accessory muscle usage, prolonged expiration, respiratory distress or retractions.     Comments: Lungs clear to auscultation without adventitious lung sounds. Musculoskeletal:     Cervical back: Normal  range of motion and neck supple.  Neurological:     General: No focal deficit present.     Mental Status: She  is alert and oriented to person, place, and time.    UC Treatments / Results  Labs (all labs ordered are listed, but only abnormal results are displayed) Labs Reviewed  NOVEL CORONAVIRUS, NAA    EKG   Radiology No results found.  Procedures Procedures (including critical care time)  Medications Ordered in UC Medications - No data to display  Initial Impression / Assessment and Plan / UC Course  I have reviewed the triage vital signs and the nursing notes.  Pertinent labs & imaging results that were available during my care of the patient were reviewed by me and considered in my medical decision making (see chart for details).    COVID PCR test ordered. However, given patient's exposure/symptoms, suspect COVID and will have patient follow quarantine instructions for COVID. No alarming signs on exam.  Patient speaking in full sentences without respiratory distress.  Symptomatic treatment discussed.  Push fluids.  Return precautions given.  Patient expresses understanding and agrees to plan.  Final Clinical Impressions(s) / UC Diagnoses   Final diagnoses:  Rhinorrhea  Sinus pressure  Exposure to COVID-19 virus    ED Prescriptions    Medication Sig Dispense Auth. Provider   fluticasone (FLONASE) 50 MCG/ACT nasal spray  (Status: Discontinued) Place 2 sprays into both nostrils daily. 1 g Imanii Gosdin V, PA-C   azelastine (ASTELIN) 0.1 % nasal spray  (Status: Discontinued) Place 2 sprays into both nostrils 2 (two) times daily. 30 mL Lorieann Argueta V, PA-C   azelastine (ASTELIN) 0.1 % nasal spray Place 2 sprays into both nostrils 2 (two) times daily. 30 mL Eliane Hammersmith V, PA-C   fluticasone (FLONASE) 50 MCG/ACT nasal spray Place 2 sprays into both nostrils daily. 1 g Ok Edwards, PA-C     PDMP not reviewed this encounter.   Ok Edwards, PA-C 04/13/20 1332

## 2020-04-13 NOTE — ED Triage Notes (Signed)
Woke up this am with sinus pain, scratchy throat, ear pain. Husband tested positive yesterday for COVID.

## 2020-04-13 NOTE — Discharge Instructions (Addendum)
COVID PCR testing ordered. As discussed, given your exposure and symptoms, I would like you to quarantine as if you are positive for COVID regardless of symptoms.  You can discontinue quarantine after 10 days of symptom onset AND 24 hours of no fever with improvement of symptoms. Start flonase, azelastine as directed. Tylenol/motrin for pain and fever. Keep hydrated, urine should be clear to pale yellow in color. If experiencing shortness of breath, trouble breathing, go to the emergency department for further evaluation needed.

## 2020-04-14 LAB — SARS-COV-2, NAA 2 DAY TAT

## 2020-04-14 LAB — NOVEL CORONAVIRUS, NAA: SARS-CoV-2, NAA: NOT DETECTED

## 2020-04-20 ENCOUNTER — Ambulatory Visit
Admission: EM | Admit: 2020-04-20 | Discharge: 2020-04-20 | Disposition: A | Discharge status: Home / Self Care | Payer: 59 | Attending: Emergency Medicine | Admitting: Emergency Medicine

## 2020-04-20 ENCOUNTER — Other Ambulatory Visit: Payer: Self-pay

## 2020-04-20 ENCOUNTER — Encounter: Payer: Self-pay | Admitting: Emergency Medicine

## 2020-04-20 DIAGNOSIS — Z20822 Contact with and (suspected) exposure to covid-19: Secondary | ICD-10-CM

## 2020-04-20 DIAGNOSIS — R430 Anosmia: Secondary | ICD-10-CM | POA: Diagnosis not present

## 2020-04-20 MED ORDER — GUAIFENESIN 400 MG PO TABS: 400.0000 mg | ORAL_TABLET | ORAL | 0 refills | Status: AC

## 2020-04-20 MED ORDER — CETIRIZINE HCL 10 MG PO TABS: 10.0000 mg | ORAL_TABLET | Freq: Every day | ORAL | 0 refills | Status: AC

## 2020-04-20 NOTE — Discharge Instructions (Signed)
Your COVID test is pending - it is important to quarantine / isolate at home until your results are back. °If you test positive and would like further evaluation for persistent or worsening symptoms, you may schedule an E-visit or virtual (video) visit throughout the [facility] MyChart app or website. ° °PLEASE NOTE: If you develop severe chest pain or shortness of breath please go to the ER or call 9-1-1 for further evaluation --> DO NOT schedule electronic or virtual visits for this. °Please call our office for further guidance / recommendations as needed. ° °For information about the Covid vaccine, please visit [email or web address] °

## 2020-04-20 NOTE — ED Triage Notes (Signed)
Seen by provider prior to clinical 

## 2020-04-20 NOTE — ED Provider Notes (Signed)
[facility]    CSN: [id number] Arrival date & time: 04/20/20  0944      History   Chief Complaint Chief Complaint  Patient presents with  . Covid Exposure    HPI Kristen Lopez is a 59 y.o. female with history of MVP  Presenting for Covid testing: Exposure: husband Date of exposure: chronic/cohabitat Any fever, symptoms since exposure: Yes-sinus congestion, myalgias, fatigue, mild/dry cough.  Patient previously evaluated for this on 4/18: Negative Covid PCR test.  Patient seeking retest as she feels this was a early false negative.  States she lost her sense of smell last night.  No fever, chest pain, difficulty breathing.   Past Medical History:  Diagnosis Date  . Anemia 09/2012  . Low iron   . MVP (mitral valve prolapse)   . Palpitations    1980  . Shortness of breath    with anemia    Patient Active Problem List   Diagnosis Date Noted  . Hospice care patient 07/25/2019  . Seasonal and perennial allergic rhinitis 02/20/2019  . Dizziness and giddiness 07/24/2013  . Disturbance of skin sensation 07/24/2013  . Cervicalgia 07/24/2013  . Personal history of diseases of blood and blood-forming organs 07/24/2013    Past Surgical History:  Procedure Laterality Date  . CYSTOCELE REPAIR N/A 07/25/2019   Procedure: ANTERIOR REPAIR (CYSTOCELE);  Surgeon: Richarda Overlie, MD;  Location: [facility];  Service: Gynecology;  Laterality: N/A;  need bed  . DILITATION & CURRETTAGE/HYSTROSCOPY WITH NOVASURE ABLATION  11/17/2012   Procedure: DILATATION & CURETTAGE/HYSTEROSCOPY WITH NOVASURE ABLATION;  Surgeon: Meriel Pica, MD;  Location: [facility];  Service: Gynecology;  Laterality: N/A;  Attempted and failed  . DILITATION & CURRETTAGE/HYSTROSCOPY WITH THERMACHOICE ABLATION  11/17/2012   Procedure: DILATATION & CURETTAGE/HYSTEROSCOPY WITH THERMACHOICE ABLATION;  Surgeon: Meriel Pica, MD;  Location: [facility];  Service: Gynecology;  Laterality:  N/A;  . EXTERNAL EAR SURGERY    . LAPAROSCOPIC ASSISTED VAGINAL HYSTERECTOMY  11/22/2012   Procedure: LAPAROSCOPIC ASSISTED VAGINAL HYSTERECTOMY;  Surgeon: Meriel Pica, MD;  Location: [facility];  Service: Gynecology;  Laterality: N/A;    OB History    Gravida  3   Para  3   Term  3   Preterm      AB      Living  3     SAB      TAB      Ectopic      Multiple      Live Births               Home Medications    Prior to Admission medications   Medication Sig Start Date End Date Taking? Authorizing Provider  Ascorbic Acid (VITAMIN C) 1000 MG tablet Take 1,000 mg by mouth daily.   Yes [provider]  cholecalciferol (VITAMIN D) 1000 units tablet Take 1,000 Units by mouth daily.    Yes [provider]  MAGNESIUM CITRATE PO Take 1 tablet by mouth daily.   Yes [provider]  Zinc Sulfate (ZINC 15 PO) Take by mouth.   Yes [provider]  azelastine (ASTELIN) 0.1 % nasal spray Place 2 sprays into both nostrils 2 (two) times daily. 04/13/20   Cathie Hoops, Amy V, PA-C  cetirizine (ZYRTEC ALLERGY) 10 MG tablet Take 1 tablet (10 mg total) by mouth daily. 04/20/20   Hall-Potvin, [place], PA-C  Elderberry 575 MG/5ML SYRP Take by mouth.    [provider]  fluticasone (FLONASE) 50 MCG/ACT nasal spray Place 2 sprays into both nostrils daily. 04/13/20   Cathie Hoops, Amy V, PA-C  guaifenesin (HUMIBID E) 400 MG TABS tablet Take 1 tablet (400 mg total) by mouth every 4 (four) hours for 7 days. 04/20/20 04/27/20  Hall-Potvin, [place], PA-C  ibuprofen (ADVIL) 200 MG tablet Take 4 tablets (800 mg total) by mouth every 8 (eight) hours as needed for moderate pain. 07/25/19   Richarda Overlie, MD  Melatonin 5 MG TABS Take 5 mg by mouth at bedtime as needed (sleep).    [provider]    Family History Family History  Problem Relation Age of Onset  . Other Father 42       Car accident  . Alcohol abuse Mother 59  . Diabetes Mother   . Hypertension  Mother   . Arthritis Mother   . Heart failure Maternal Grandmother   . Hypertension Paternal Grandmother   . ALS Paternal Grandfather     Social History Social History   Tobacco Use  . Smoking status: Never Smoker  . Smokeless tobacco: Never Used  Substance Use Topics  . Alcohol use: Yes    Alcohol/week: 1.0 standard drinks    Types: 1 Glasses of wine per week    Comment: socially  . Drug use: No     Allergies   Amoxicillin, Azithromycin, and Erythromycin   Review of Systems As per HPI   Physical Exam Triage Vital Signs ED Triage Vitals  Enc Vitals Group     BP      Pulse      Resp      Temp      Temp src      SpO2      Weight      Height      Head Circumference      Peak Flow      Pain Score      Pain Loc      Pain Edu?      Excl. in GC?    No data found.  Updated Vital Signs BP 119/87   Pulse 84   Temp 98 F (36.7 C) (Oral)   Resp 18   LMP 08/01/2012   SpO2 95%   Visual Acuity Right Eye Distance:   Left Eye Distance:   Bilateral Distance:    Right Eye Near:   Left Eye Near:    Bilateral Near:     Physical Exam Constitutional:      General: She is not in acute distress.    Appearance: She is obese. She is not ill-appearing or diaphoretic.  HENT:     Head: Normocephalic and atraumatic.     Mouth/Throat:     Mouth: Mucous membranes are moist.     Pharynx: Oropharynx is clear. No oropharyngeal exudate or posterior oropharyngeal erythema.  Eyes:     General: No scleral icterus.    Conjunctiva/sclera: Conjunctivae normal.     Pupils: Pupils are equal, round, and reactive to light.  Neck:     Comments: Trachea midline, negative JVD Cardiovascular:     Rate and Rhythm: Normal rate and regular rhythm.     Heart sounds: No murmur. No gallop.   Pulmonary:     Effort: Pulmonary effort is normal. No respiratory distress.     Breath sounds: No wheezing, rhonchi or rales.  Musculoskeletal:     Cervical back: Neck supple. No tenderness.    Lymphadenopathy:     Cervical: No cervical  adenopathy.  Skin:    Capillary Refill: Capillary refill takes less than 2 seconds.     Coloration: Skin is not jaundiced or pale.     Findings: No rash.  Neurological:     General: No focal deficit present.     Mental Status: She is alert and oriented to person, place, and time.      UC Treatments / Results  Labs (all labs ordered are listed, but only abnormal results are displayed) Labs Reviewed  NOVEL CORONAVIRUS, NAA    EKG   Radiology No results found.  Procedures Procedures (including critical care time)  Medications Ordered in UC Medications - No data to display  Initial Impression / Assessment and Plan / UC Course  I have reviewed the triage vital signs and the nursing notes.  Pertinent labs & imaging results that were available during my care of the patient were reviewed by me and considered in my medical decision making (see chart for details).     Patient afebrile, nontoxic, with SpO2 95%.  Discussed utility of Covid testing in setting of known exposure with previously negative PCR.  Patient presented specifically for repeat test  - Covid PCR pending.  Patient to quarantine until results are back.  We will treat supportively as outlined below.  Return precautions discussed, patient verbalized understanding and is agreeable to plan. Final Clinical Impressions(s) / UC Diagnoses   Final diagnoses:  Loss of sense of smell  Exposure to COVID-19 virus     Discharge Instructions     Your COVID test is pending - it is important to quarantine / isolate at home until your results are back. If you test positive and would like further evaluation for persistent or worsening symptoms, you may schedule an E-visit or virtual (video) visit throughout the [facility] app or website.  PLEASE NOTE: If you develop severe chest pain or shortness of breath please go to the ER or call [id number] for further evaluation --> DO NOT  schedule electronic or virtual visits for this. Please call our office for further guidance / recommendations as needed.  For information about the Covid vaccine, please visit [email or web address]    ED Prescriptions    Medication Sig Dispense Auth. Provider   cetirizine (ZYRTEC ALLERGY) 10 MG tablet Take 1 tablet (10 mg total) by mouth daily. 30 tablet Hall-Potvin, [place], PA-C   guaifenesin (HUMIBID E) 400 MG TABS tablet Take 1 tablet (400 mg total) by mouth every 4 (four) hours for 7 days. 42 tablet Hall-Potvin, [place], PA-C     PDMP not reviewed this encounter.   Hall-Potvin, [place], [place] 04/20/20 1255

## 2020-04-21 LAB — NOVEL CORONAVIRUS, NAA: SARS-CoV-2, NAA: DETECTED — AB

## 2020-04-21 LAB — SARS-COV-2, NAA 2 DAY TAT

## 2020-04-22 ENCOUNTER — Other Ambulatory Visit: Payer: 59 | Admitting: Orthotics

## 2020-04-22 ENCOUNTER — Ambulatory Visit: Payer: 59 | Admitting: Podiatry

## 2020-05-13 ENCOUNTER — Ambulatory Visit (INDEPENDENT_AMBULATORY_CARE_PROVIDER_SITE_OTHER): Payer: 59

## 2020-05-13 ENCOUNTER — Other Ambulatory Visit: Payer: Self-pay

## 2020-05-13 ENCOUNTER — Ambulatory Visit (INDEPENDENT_AMBULATORY_CARE_PROVIDER_SITE_OTHER): Payer: 59 | Admitting: Orthotics

## 2020-05-13 ENCOUNTER — Ambulatory Visit: Payer: 59 | Admitting: Podiatry

## 2020-05-13 ENCOUNTER — Encounter: Payer: Self-pay | Admitting: Podiatry

## 2020-05-13 DIAGNOSIS — M2012 Hallux valgus (acquired), left foot: Secondary | ICD-10-CM

## 2020-05-13 DIAGNOSIS — M722 Plantar fascial fibromatosis: Secondary | ICD-10-CM | POA: Diagnosis not present

## 2020-05-13 NOTE — Progress Notes (Signed)
She presents today to see Kristen Lopez for a new pair of orthotics.  She would like to discuss a possible bunion surgery.  She states that it seems to be getting worse the right feels perfectly fine her left foot is very wide and would like to consider getting it fixed.  It is painful after she has been standing on it all day she works at [employer] and she is the only one in her department.  She relates that her health is good.  ROS: Denies fever chills nausea vomiting muscle aches pains calf pain back pain chest pain shortness of breath.  Objective: Vital signs are stable she is alert and oriented x3.  Pulses are palpable.  She has no reproducible pain today that she has limited range of motion of the first metatarsophalangeal joint left foot with moderate to severe hallux abductovalgus deformity not reducible intermetatarsal angle and a mild elevated second toe at the level of the metatarsophalangeal joint.  No open lesions or wounds are noted.  Radiographs were reviewed today demonstrating increased first intermetatarsal angle hypertrophic medial condyle to the head of the first metatarsal hallux abductus angle is greater than normal values with no significant osteoarthritic change.  Assessment: Hallux abductovalgus deformity history of plantar fasciitis.  Capsulitis's first and second metatarsophalangeal joint left.  Plan: Discussed etiology pathology conservative surgical therapies at this point she was scanned for new orthotics today by Kristen Lopez and we discussed in great detail today surgical intervention she states that she would like to wait for about a year until she can have time to get someone to help her with work.

## 2020-05-13 NOTE — Progress Notes (Signed)

## 2020-06-09 ENCOUNTER — Telehealth: Payer: Self-pay | Admitting: Podiatry

## 2020-06-09 NOTE — Telephone Encounter (Signed)
Pt left message about appt tomorrow and wanted to make sure she did not need to change it because she has to be on the other side of by 1000am.  I returned call and her appt is at 845am and just to pick up orthotics. She should be fine as she is the second person on his schedule.   She then asked if she could get some paperwork on how to stretch her feet. Morrie Sheldon is there anything you could print for pt to pick up at her appt tomorrow.

## 2020-06-10 ENCOUNTER — Other Ambulatory Visit: Payer: Self-pay

## 2020-06-10 ENCOUNTER — Ambulatory Visit: Payer: 59 | Admitting: Orthotics

## 2020-06-10 DIAGNOSIS — M2012 Hallux valgus (acquired), left foot: Secondary | ICD-10-CM

## 2020-06-10 NOTE — Patient Instructions (Signed)
Plantar Fasciitis Rehab Ask your health care provider which exercises are safe for you. Do exercises exactly as told by your health care provider and adjust them as directed. It is normal to feel mild stretching, pulling, tightness, or discomfort as you do these exercises. Stop right away if you feel sudden pain or your pain gets worse. Do not begin these exercises until told by your health care provider. Stretching and range-of-motion exercises These exercises warm up your muscles and joints and improve the movement and flexibility of your foot. These exercises also help to relieve pain. Plantar fascia stretch  1. Sit with your left / right leg crossed over your opposite knee. 2. Hold your heel with one hand with that thumb near your arch. With your other hand, hold your toes and gently pull them back toward the top of your foot. You should feel a stretch on the bottom of your toes or your foot (plantar fascia) or both. 3. Hold this stretch for__________ seconds. 4. Slowly release your toes and return to the starting position. Repeat __________ times. Complete this exercise __________ times a day. Gastrocnemius stretch, standing This exercise is also called a calf (gastroc) stretch. It stretches the muscles in the back of the upper calf. 1. Stand with your hands against a wall. 2. Extend your left / right leg behind you, and bend your front knee slightly. 3. Keeping your heels on the floor and your back knee straight, shift your weight toward the wall. Do not arch your back. You should feel a gentle stretch in your upper left / right calf. 4. Hold this position for __________ seconds. Repeat __________ times. Complete this exercise __________ times a day. Soleus stretch, standing This exercise is also called a calf (soleus) stretch. It stretches the muscles in the back of the lower calf. 1. Stand with your hands against a wall. 2. Extend your left / right leg behind you, and bend your front  knee slightly. 3. Keeping your heels on the floor, bend your back knee and shift your weight slightly over your back leg. You should feel a gentle stretch deep in your lower calf. 4. Hold this position for __________ seconds. Repeat __________ times. Complete this exercise __________ times a day. Gastroc and soleus stretch, standing step This exercise stretches the muscles in the back of the lower leg. These muscles are in the upper calf (gastrocnemius) and the lower calf (soleus). 1. Stand with the ball of your left / right foot on a step. The ball of your foot is on the walking surface, right under your toes. 2. Keep your other foot firmly on the same step. 3. Hold on to the wall or a railing for balance. 4. Slowly lift your other foot, allowing your body weight to press your left / right heel down over the edge of the step. You should feel a stretch in your left / right calf. 5. Hold this position for __________ seconds. 6. Return both feet to the step. 7. Repeat this exercise with a slight bend in your left / right knee. Repeat __________ times with your left / right knee straight and __________ times with your left / right knee bent. Complete this exercise __________ times a day. Balance exercise This exercise builds your balance and strength control of your arch to help take pressure off your plantar fascia. Single leg stand If this exercise is too easy, you can try it with your eyes closed or while standing on a pillow. 1.   Without shoes, stand near a railing or in a doorway. You may hold on to the railing or door frame as needed. 2. Stand on your left / right foot. Keep your big toe down on the floor and try to keep your arch lifted. Do not let your foot roll inward. 3. Hold this position for __________ seconds. Repeat __________ times. Complete this exercise __________ times a day. This information is not intended to replace advice given to you by your health care provider. Make sure  you discuss any questions you have with your health care provider. Document Revised: 04/05/2019 Document Reviewed: 10/11/2018 Elsevier Patient Education  2020 Elsevier Inc.  

## 2020-06-10 NOTE — Progress Notes (Signed)
Patient came in today to p/up functional foot orthotics.   The orthotics were assessed to both fit and function.  The F/O addressed the biomechanical issues/pathologies as intended, offering good longitudinal arch support, proper offloading, and foot support. There weren't any signs of discomfort or irritation.  The F/O fit properly in footwear with minimal trimming/adjustments. 

## 2020-10-30 ENCOUNTER — Ambulatory Visit: Payer: 59 | Attending: Physician Assistant | Admitting: Physical Therapy

## 2020-10-30 ENCOUNTER — Other Ambulatory Visit: Payer: Self-pay

## 2020-10-30 VITALS — BP 157/95

## 2020-10-30 DIAGNOSIS — R2681 Unsteadiness on feet: Secondary | ICD-10-CM | POA: Diagnosis present

## 2020-10-30 DIAGNOSIS — R42 Dizziness and giddiness: Secondary | ICD-10-CM | POA: Insufficient documentation

## 2020-10-31 ENCOUNTER — Encounter: Payer: Self-pay | Admitting: Physical Therapy
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
## 2020-10-31 NOTE — Patient Instructions (Signed)
Gaze Stabilization: Standing Feet Apart    Feet shoulder width apart, keeping eyes on target on wall __6__ feet away, tilt head down 15-30 and move head side to side for _60___ seconds. Repeat while moving head up and down for _60___ seconds. Do __3__ sessions per day. Repeat using target on pattern background.  Copyright  VHI. All rights reserved.

## 2020-11-03 ENCOUNTER — Telehealth: Payer: Self-pay | Admitting: Emergency Medicine

## 2020-11-03 NOTE — Telephone Encounter (Signed)
-----   Message from York Spaniel, MD sent at 10/29/2020  1:48 PM EDT ----- Molli Knock to try to work this patient in sooner, may use a cancellation, could be seen on Friday, has not been seen since 2014, is a new patient.

## 2020-11-03 NOTE — Telephone Encounter (Signed)
Left message for patient regarding an earlier appointment.  12/17/20 is on hold for patient.  Asked her to call back at her earliest convenience.

## 2020-11-03 NOTE — Telephone Encounter (Signed)
Left message for patient to return call at earliest convenience regarding scheduling earlier appointment. Have 12/17/20 @ 730 on hold for patient.

## 2020-11-03 NOTE — Telephone Encounter (Signed)
-----   Message from Charles K Willis, MD sent at 10/29/2020  1:48 PM EDT ----- Okay to try to work this patient in sooner, may use a cancellation, could be seen on Friday, has not been seen since 2014, is a new patient. 

## 2020-11-04 ENCOUNTER — Encounter: Payer: Self-pay | Admitting: Emergency Medicine

## 2020-11-04 ENCOUNTER — Ambulatory Visit: Payer: 59 | Admitting: Physical Therapy

## 2020-11-04 NOTE — Telephone Encounter (Signed)
-----   Message from Charles K Willis, MD sent at 10/29/2020  1:48 PM EDT ----- Okay to try to work this patient in sooner, may use a cancellation, could be seen on Friday, has not been seen since 2014, is a new patient. 

## 2020-11-04 NOTE — Telephone Encounter (Signed)
Left message for patient to return call at earliest convenience regarding cancellation appointment available.

## 2020-11-05 ENCOUNTER — Telehealth: Payer: Self-pay | Admitting: Emergency Medicine

## 2020-11-05 NOTE — Telephone Encounter (Signed)
Left message (day 3) for patient to return call regarding earlier appointment.

## 2021-01-15 ENCOUNTER — Ambulatory Visit: Payer: 59 | Admitting: Neurology

## 2021-05-31 ENCOUNTER — Ambulatory Visit (HOSPITAL_COMMUNITY): Payer: Self-pay

## 2021-06-01 ENCOUNTER — Ambulatory Visit: Payer: Self-pay

## 2021-07-10 ENCOUNTER — Other Ambulatory Visit: Payer: Self-pay | Admitting: Physician Assistant

## 2021-07-10 DIAGNOSIS — R1030 Lower abdominal pain, unspecified: Secondary | ICD-10-CM

## 2021-07-16 ENCOUNTER — Other Ambulatory Visit: Payer: Self-pay | Admitting: Physician Assistant

## 2021-07-16 DIAGNOSIS — R1030 Lower abdominal pain, unspecified: Secondary | ICD-10-CM

## 2021-07-17 ENCOUNTER — Other Ambulatory Visit: Payer: 59

## 2021-07-27 ENCOUNTER — Other Ambulatory Visit: Payer: Self-pay | Admitting: Gastroenterology

## 2021-07-27 DIAGNOSIS — R1084 Generalized abdominal pain: Secondary | ICD-10-CM

## 2021-08-05 ENCOUNTER — Other Ambulatory Visit: Payer: Self-pay

## 2021-08-05 ENCOUNTER — Ambulatory Visit
Admission: RE | Admit: 2021-08-05 | Discharge: 2021-08-05 | Disposition: A | Discharge status: Home / Self Care | Payer: 59 | Source: Ambulatory Visit | Attending: Gastroenterology | Admitting: Gastroenterology

## 2021-08-05 DIAGNOSIS — R1084 Generalized abdominal pain: Secondary | ICD-10-CM

## 2022-01-14 ENCOUNTER — Ambulatory Visit: Payer: 59 | Admitting: Podiatry

## 2022-01-19 ENCOUNTER — Encounter: Payer: Self-pay | Admitting: Podiatry

## 2022-01-19 ENCOUNTER — Ambulatory Visit (INDEPENDENT_AMBULATORY_CARE_PROVIDER_SITE_OTHER): Payer: 59 | Admitting: Podiatry

## 2022-01-19 ENCOUNTER — Other Ambulatory Visit: Payer: Self-pay

## 2022-01-19 ENCOUNTER — Ambulatory Visit: Payer: 59

## 2022-01-19 DIAGNOSIS — E559 Vitamin D deficiency, unspecified: Secondary | ICD-10-CM | POA: Insufficient documentation

## 2022-01-19 DIAGNOSIS — F419 Anxiety disorder, unspecified: Secondary | ICD-10-CM | POA: Insufficient documentation

## 2022-01-19 DIAGNOSIS — K2930 Chronic superficial gastritis without bleeding: Secondary | ICD-10-CM | POA: Insufficient documentation

## 2022-01-19 DIAGNOSIS — K219 Gastro-esophageal reflux disease without esophagitis: Secondary | ICD-10-CM | POA: Insufficient documentation

## 2022-01-19 DIAGNOSIS — M2011 Hallux valgus (acquired), right foot: Secondary | ICD-10-CM | POA: Diagnosis not present

## 2022-01-19 DIAGNOSIS — E7800 Pure hypercholesterolemia, unspecified: Secondary | ICD-10-CM | POA: Insufficient documentation

## 2022-01-19 DIAGNOSIS — M2012 Hallux valgus (acquired), left foot: Secondary | ICD-10-CM

## 2022-01-19 DIAGNOSIS — R1010 Upper abdominal pain, unspecified: Secondary | ICD-10-CM | POA: Insufficient documentation

## 2022-01-19 DIAGNOSIS — K760 Fatty (change of) liver, not elsewhere classified: Secondary | ICD-10-CM | POA: Insufficient documentation

## 2022-01-19 DIAGNOSIS — E669 Obesity, unspecified: Secondary | ICD-10-CM | POA: Insufficient documentation

## 2022-01-19 DIAGNOSIS — R1310 Dysphagia, unspecified: Secondary | ICD-10-CM | POA: Insufficient documentation

## 2022-01-19 DIAGNOSIS — N951 Menopausal and female climacteric states: Secondary | ICD-10-CM | POA: Insufficient documentation

## 2022-01-19 NOTE — Progress Notes (Signed)
She presents today chief complaint of painful forefoot bilateral and bunion deformities.  She has a history of Planter fasciitis.  She states that she would like to have new orthotics.  Objective: Vital signs are stable alert oriented x3 there is no erythema edema cellulitis drainage odor hallux abductovalgus deformity bilateral mild pes planus and pain on palpation MucoClear tubercles indicative of plantar fasciitis.  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  Plan: New orthotics were made for her today.

## 2022-01-19 NOTE — Progress Notes (Signed)
SITUATION Reason for Consult: Evaluation for Bilateral Custom Foot Orthoses Patient / Caregiver Report: Patient is ready for foot orthotics  OBJECTIVE DATA: Patient History / Diagnosis:    ICD-10-CM   1. Hav (hallux abducto valgus), left  M20.12       Current or Previous Devices: None and no history  Foot Examination: Skin presentation:   Intact Ulcers & Callousing:   None and no history Toe / Foot Deformities:  Hallux Valgus Weight Bearing Presentation:  Rectus Sensation:    Intact  ORTHOTIC RECOMMENDATION Recommended Device: 1x pair of custom functional foot orthotics  GOALS OF ORTHOSES - Reduce Pain - Prevent Foot Deformity - Prevent Progression of Further Foot Deformity - Relieve Pressure - Improve the Overall Biomechanical Function of the Foot and Lower Extremity.  ACTIONS PERFORMED Patient was casted for Foot Orthoses via crush box. Procedure was explained and patient tolerated procedure well. All questions were answered and concerns addressed.  PLAN Potential out of pocket cost was communicated to patient. Casts are to be sent to [facility] for fabrication. Patient is to be called for fitting when devices are ready.

## 2022-02-13 IMAGING — US US ABDOMEN LIMITED
1 series · 14 of 25 positions shown · non-contrast
Comparison: None.

CLINICAL DATA: Generalized abdominal pain

EXAM:
ULTRASOUND ABDOMEN LIMITED RIGHT UPPER QUADRANT

[Series 1: us abdomen limited · 0.17mm/px · 14 of 38 slices shown]
[im 1/38]
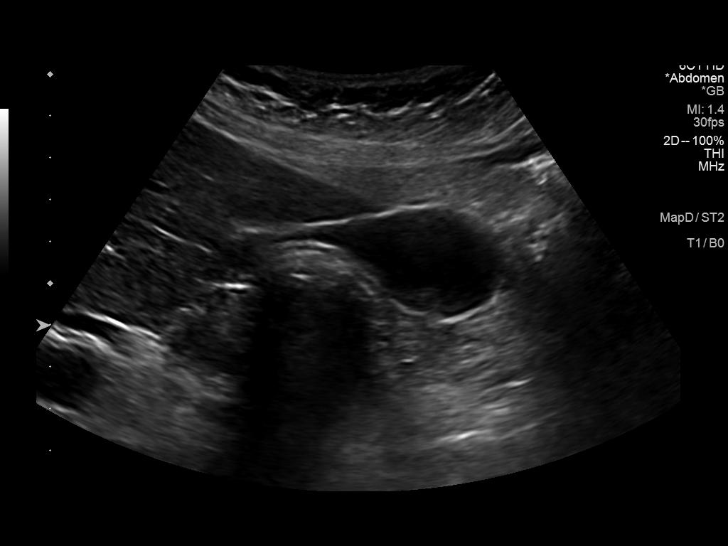
[im 4/38]
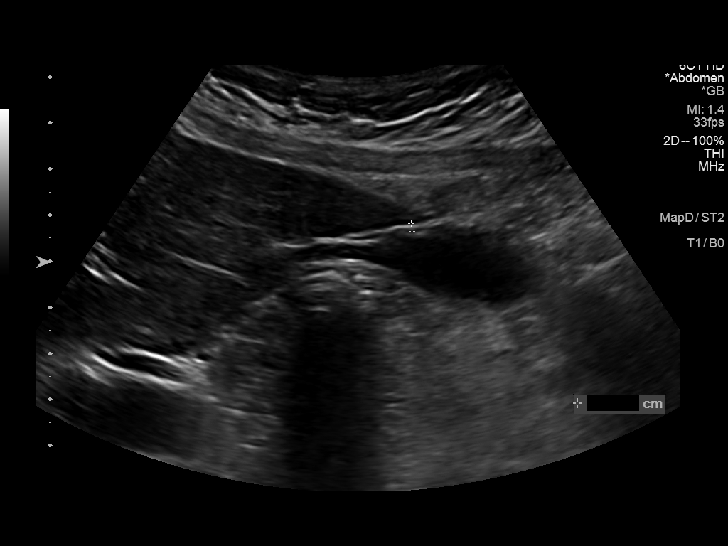
[im 7/38]
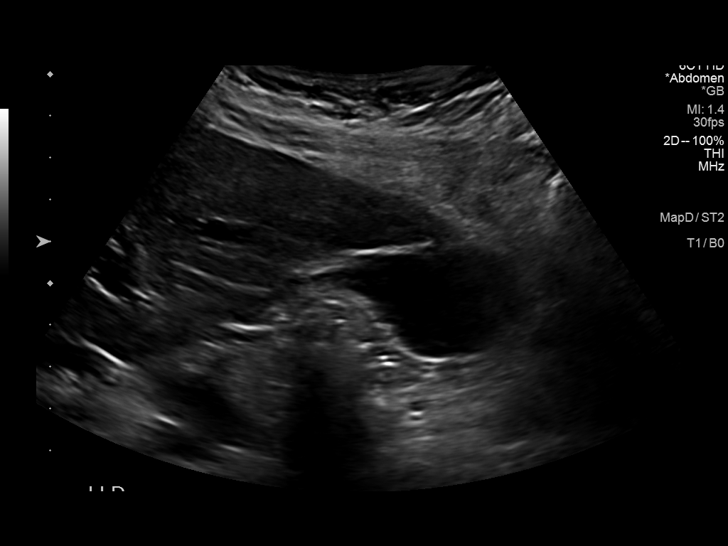
[im 10/38]
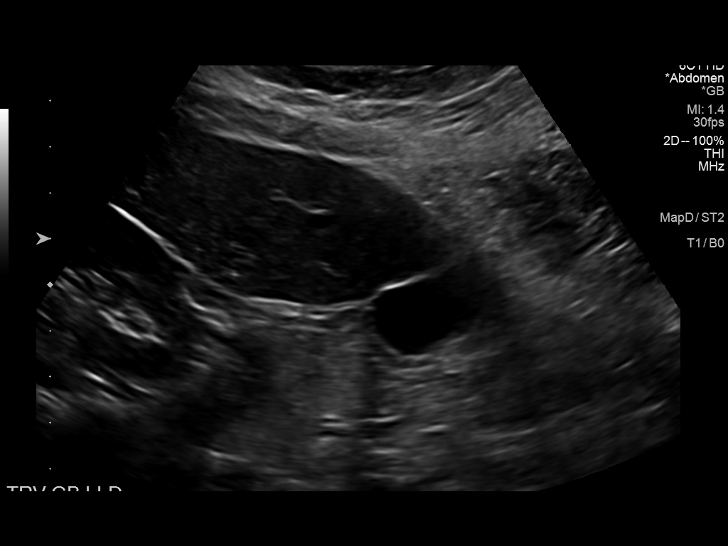
[im 13/38]
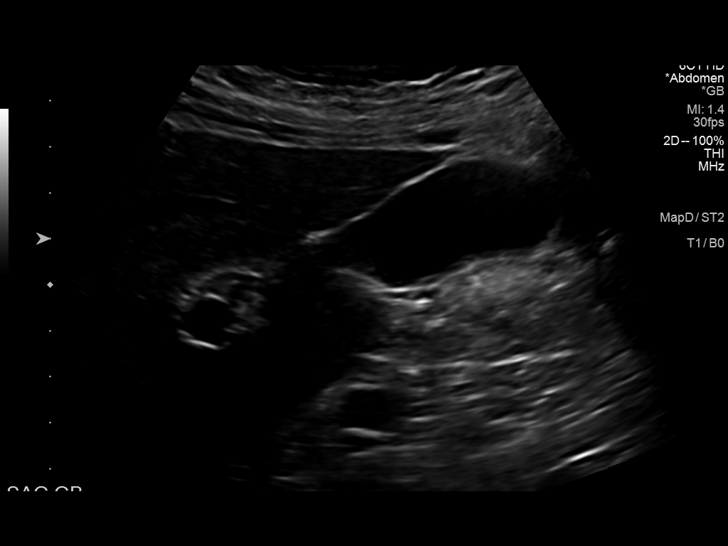
[im 14/38]
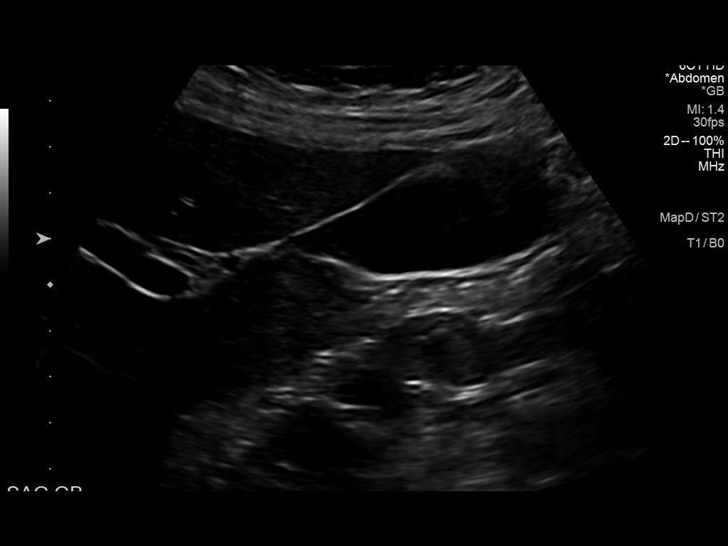
[im 17/38]
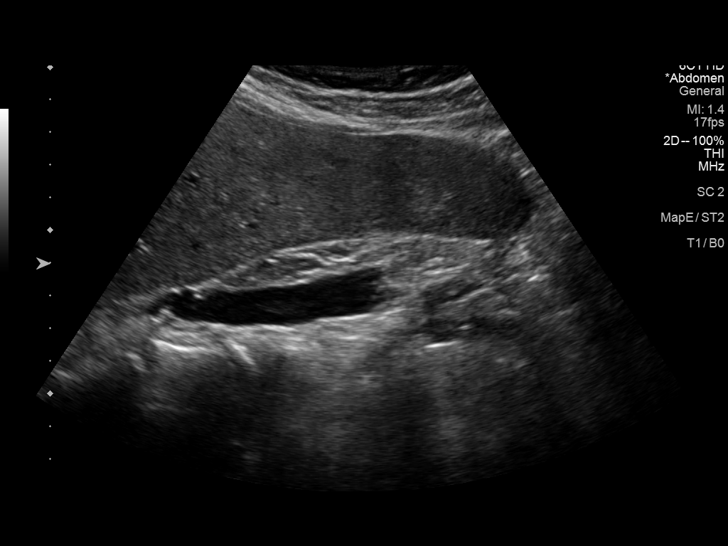
[im 21/38]
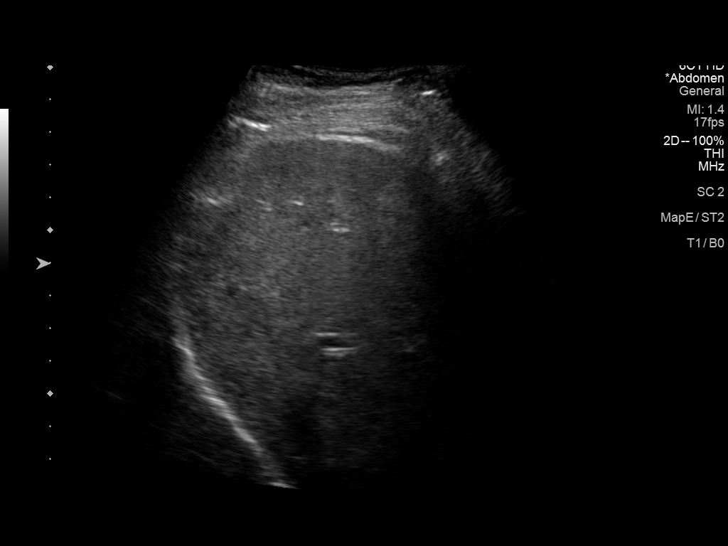
[im 24/38]
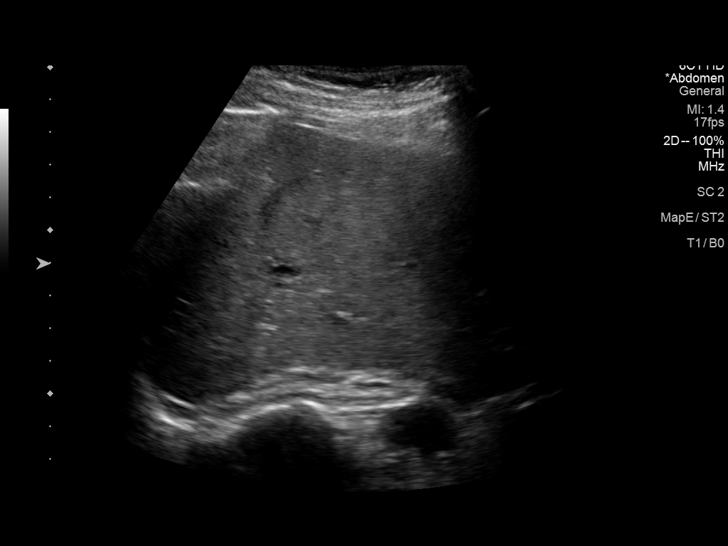
[im 25/38]
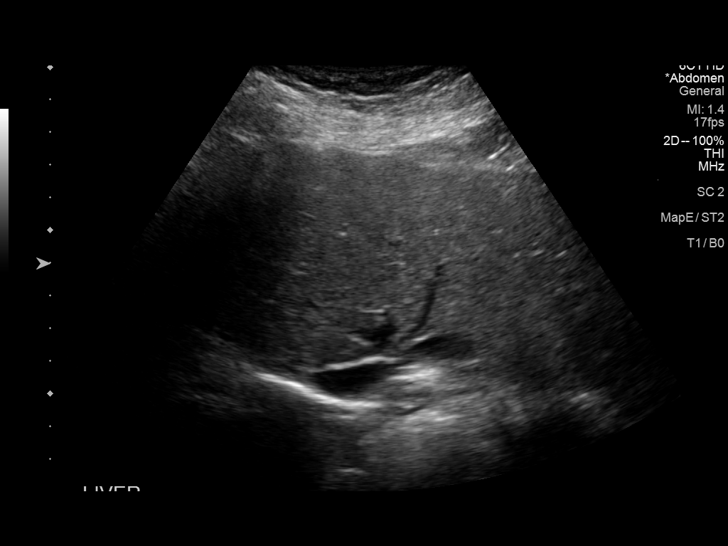
[im 28/38]
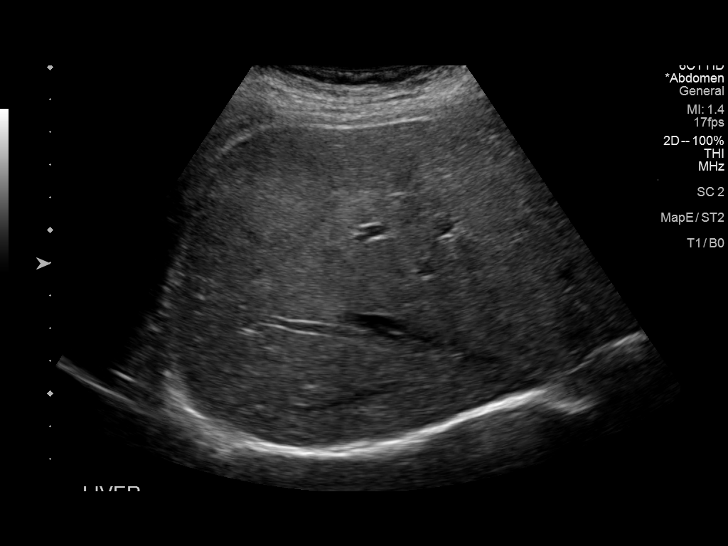
[im 31/38]
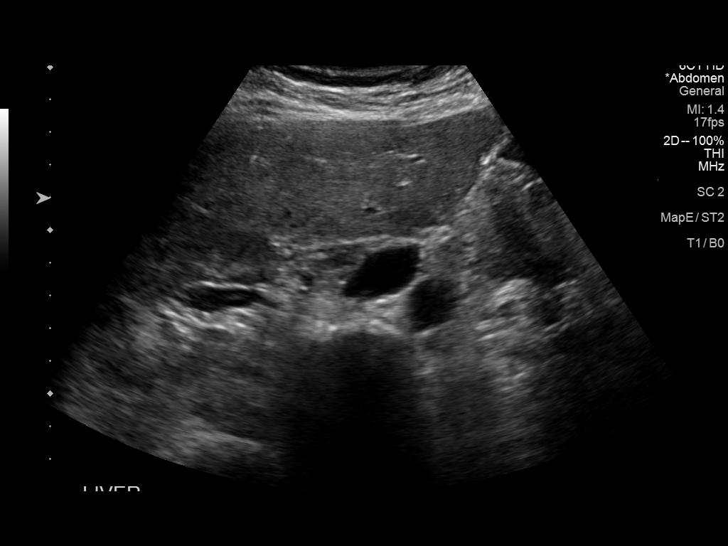
[im 34/38]
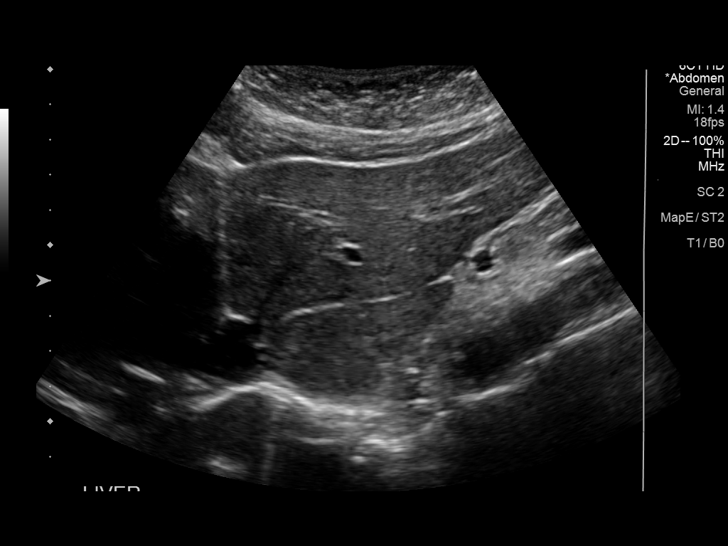
[im 38/38]
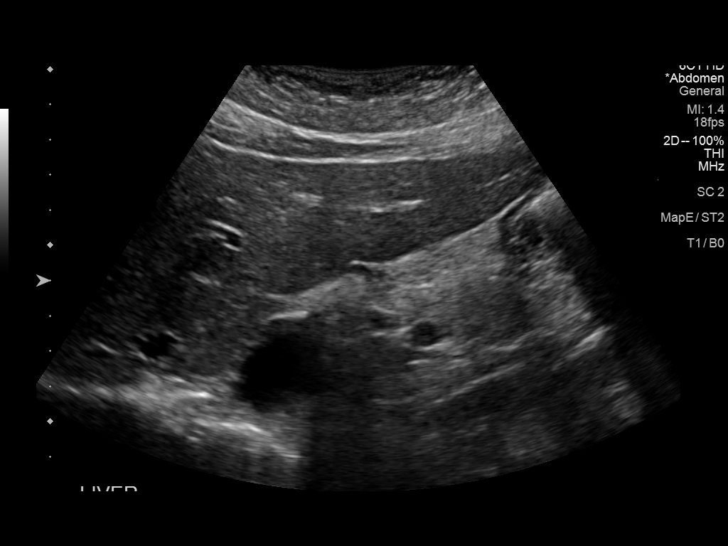

[14 of 25 positions shown; findings below may reference images not displayed]

FINDINGS: Gallbladder:

No gallstones or wall thickening visualized. No sonographic Murphy
sign noted by sonographer.

Common bile duct:

Diameter: 3.2 mm

Liver:

Increased echogenicity liver diffusely. No focal liver mass. Portal
vein is patent on color Doppler imaging with normal direction of
blood flow towards the liver.

Other: None.
IMPRESSION: Negative for gallstones

Increased echogenicity liver compatible with hepatocellular disease,
possibly fatty liver.

## 2022-02-16 ENCOUNTER — Other Ambulatory Visit: Payer: Self-pay

## 2022-02-16 ENCOUNTER — Other Ambulatory Visit: Payer: 59

## 2022-03-03 ENCOUNTER — Telehealth: Payer: Self-pay | Admitting: Podiatry

## 2022-03-03 NOTE — Telephone Encounter (Signed)
Lvm for patient to call and schedule an appointment to pick up orthotics  ?

## 2022-04-16 ENCOUNTER — Telehealth: Payer: Self-pay | Admitting: Podiatry

## 2022-04-16 NOTE — Telephone Encounter (Signed)
Exercises placed in patient instructions to be printed out with AVS for that date of service ?

## 2022-04-16 NOTE — Patient Instructions (Signed)

## 2022-04-16 NOTE — Telephone Encounter (Signed)
Pt would like a print out of the exercises for PF. She has an orthotic pick up appt with Aaron Edelman on 4/25. She can pick up at that time if possible. ? ?Please advise ?

## 2022-04-20 ENCOUNTER — Ambulatory Visit (INDEPENDENT_AMBULATORY_CARE_PROVIDER_SITE_OTHER): Payer: 59

## 2022-04-20 DIAGNOSIS — M2011 Hallux valgus (acquired), right foot: Secondary | ICD-10-CM | POA: Diagnosis not present

## 2022-04-20 DIAGNOSIS — M2012 Hallux valgus (acquired), left foot: Secondary | ICD-10-CM | POA: Diagnosis not present

## 2022-04-20 NOTE — Progress Notes (Signed)
SITUATION: ?Reason for Visit: Fitting and Delivery of Custom Fabricated Foot Orthoses ?Patient Report: Patient reports comfort and is satisfied with device. ? ?OBJECTIVE DATA: ?Patient History / Diagnosis:   ?  ICD-10-CM   ?1. 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  ? ? ?Provided Device:  Custom Functional Foot Orthotics ?    RicheyLAB: [id number] ? ?GOAL OF ORTHOSIS ?- Improve gait ?- Decrease energy expenditure ?- Improve Balance ?- Provide Triplanar stability of foot complex ?- Facilitate motion ? ?ACTIONS PERFORMED ?Patient was fit with foot orthotics trimmed to shoe last. Patient tolerated fittign procedure.  ? ?Patient was provided with verbal and written instruction and demonstration regarding donning, doffing, wear, care, proper fit, function, purpose, cleaning, and use of the orthosis and in all related precautions and risks and benefits regarding the orthosis. ? ?Patient was also provided with verbal instruction regarding how to report any failures or malfunctions of the orthosis and necessary follow up care. Patient was also instructed to contact our office regarding any change in status that may affect the function of the orthosis. ? ?Patient demonstrated independence with proper donning, doffing, and fit and verbalized understanding of all instructions. ? ?PLAN: ?Patient is to follow up in one week or as necessary (PRN). All questions were answered and concerns addressed. Plan of care was discussed with and agreed upon by the patient. ? ?

## 2022-11-06 ENCOUNTER — Encounter: Payer: Self-pay | Admitting: Emergency Medicine

## 2022-11-06 ENCOUNTER — Ambulatory Visit
Admission: EM | Admit: 2022-11-06 | Discharge: 2022-11-06 | Disposition: A | Discharge status: Home / Self Care | Payer: 59 | Attending: Physician Assistant | Admitting: Physician Assistant

## 2022-11-06 DIAGNOSIS — W57XXXA Bitten or stung by nonvenomous insect and other nonvenomous arthropods, initial encounter: Secondary | ICD-10-CM

## 2022-11-06 MED ORDER — DOXYCYCLINE HYCLATE 100 MG PO CAPS: 100.0000 mg | ORAL_CAPSULE | Freq: Two times a day (BID) | ORAL | 0 refills | Status: AC

## 2022-11-06 NOTE — ED Triage Notes (Signed)
Pt found a tick on her left side under bra strap. There is a red area with possible scab. Making sure the head is out.

## 2022-11-12 NOTE — ED Provider Notes (Signed)
[facility]    CSN: [id number] Arrival date & time: 11/06/22  1307      History   Chief Complaint Chief Complaint  Patient presents with   Insect Bite    HPI Kristen Lopez is a 61 y.o. female.   She is concerned that she has a piece of a tick remaining in her back.  Patient reports her husband removed a tick but there is still a dark area that they are.  The history is provided by the patient. No language interpreter was used.    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/25/2019   Seasonal and perennial allergic rhinitis 02/20/2019   Pain in right hand 04/26/2018   Trigger finger 04/26/2018   Strain of knee and leg, left 09/12/2014   Open wound of finger 11/28/2013   Cellulitis and abscess of finger 11/21/2013   Essential hypertension 11/21/2013   Dizziness and giddiness 07/24/2013   Disturbance of skin sensation 07/24/2013   Cervicalgia 07/24/2013   Personal history of diseases of blood and blood-forming organs 07/24/2013    Past Surgical History:  Procedure Laterality Date   CYSTOCELE REPAIR N/A 07/25/2019   Procedure: ANTERIOR REPAIR (CYSTOCELE);  Surgeon: Richarda Overlie, MD;  Location: [facility];  Service: Gynecology;  Laterality: N/A;  need bed   DILITATION & CURRETTAGE/HYSTROSCOPY WITH NOVASURE ABLATION  11/17/2012   Procedure: DILATATION & CURETTAGE/HYSTEROSCOPY WITH NOVASURE ABLATION;  Surgeon:  Meriel Pica, MD;  Location: [facility];  Service: Gynecology;  Laterality: N/A;  Attempted and failed   DILITATION & CURRETTAGE/HYSTROSCOPY WITH THERMACHOICE ABLATION  11/17/2012   Procedure: DILATATION & CURETTAGE/HYSTEROSCOPY WITH THERMACHOICE ABLATION;  Surgeon: Meriel Pica, MD;  Location: [facility];  Service: Gynecology;  Laterality: N/A;   EXTERNAL EAR SURGERY     LAPAROSCOPIC ASSISTED VAGINAL HYSTERECTOMY  11/22/2012   Procedure: LAPAROSCOPIC ASSISTED VAGINAL HYSTERECTOMY;  Surgeon: Meriel Pica, MD;  Location: [facility];  Service: Gynecology;  Laterality: N/A;    OB History     Gravida  3   Para  3   Term  3   Preterm      AB      Living  3      SAB      IAB      Ectopic      Multiple      Live Births               Home Medications    Prior to Admission medications   Medication Sig Start Date End Date Taking? Authorizing Provider  doxycycline (VIBRAMYCIN) 100 MG capsule Take 1 capsule (100 mg total) by mouth 2 (two) times daily. 11/06/22  Yes Cheron Schaumann K, PA-C  Ascorbic Acid (VITAMIN C) 1000 MG tablet Take 1,000 mg by mouth daily.    [provider]  azelastine (ASTELIN) 0.1 % nasal spray Place 2 sprays into  both nostrils 2 (two) times daily. Patient not taking: Reported on 10/30/2020 04/13/20   Belinda Fisher, PA-C  cetirizine (ZYRTEC ALLERGY) 10 MG tablet Take 1 tablet (10 mg total) by mouth daily. 04/20/20   Hall-Potvin, [place], PA-C  cholecalciferol (VITAMIN D) 1000 units tablet Take 1,000 Units by mouth daily.     [provider]  Elderberry 575 MG/5ML SYRP Take by mouth. Patient not taking: Reported on 10/30/2020    [provider]  fluticasone (FLONASE) 50 MCG/ACT nasal spray Place 2 sprays into both nostrils daily. Patient not taking: Reported on 10/30/2020 04/13/20   Belinda Fisher, PA-C  ibuprofen (ADVIL) 200 MG tablet Take 4 tablets (800 mg total) by mouth every 8 (eight) hours as needed for moderate pain. 07/25/19    Richarda Overlie, MD  ivermectin (STROMECTOL) 3 MG TABS tablet SMARTSIG:5 Tablet(s) By Mouth Once Patient not taking: Reported on 10/30/2020 04/21/20   [provider]  MAGNESIUM CITRATE PO Take 1 tablet by mouth daily.    [provider]  Melatonin 5 MG TABS Take 5 mg by mouth at bedtime as needed (sleep).    [provider]  Zinc Sulfate (ZINC 15 PO) Take by mouth.    [provider]    Family History Family History  Problem Relation Age of Onset   Other Father 68       Car accident   Alcohol abuse Mother 42   Diabetes Mother    Hypertension Mother    Arthritis Mother    Heart failure Maternal Grandmother    Hypertension Paternal Grandmother    ALS Paternal Grandfather     Social History Social History   Tobacco Use   Smoking status: Never   Smokeless tobacco: Never  Vaping Use   Vaping Use: Never used  Substance Use Topics   Alcohol use: Yes    Alcohol/week: 1.0 standard drink of alcohol    Types: 1 Glasses of wine per week    Comment: socially   Drug use: No     Allergies   Cefdinir, Amoxicillin, Azithromycin, and Erythromycin   Review of Systems Review of Systems  All other systems reviewed and are negative.    Physical Exam Triage Vital Signs ED Triage Vitals  Enc Vitals Group     BP 11/06/22 1358 (!) 140/92     Pulse Rate 11/06/22 1358 86     Resp 11/06/22 1358 16     Temp 11/06/22 1358 98.7 F (37.1 C)     Temp Source 11/06/22 1358 Oral     SpO2 11/06/22 1358 98 %     Weight --      Height --      Head Circumference --      Peak Flow --      Pain Score 11/06/22 1357 0     Pain Loc --      Pain Edu? --      Excl. in GC? --    No data found.  Updated Vital Signs BP (!) 140/92 (BP Location: Left Arm)   Pulse 86   Temp 98.7 F (37.1 C) (Oral)   Resp 16   LMP 08/01/2012   SpO2 98%   Visual Acuity Right Eye Distance:   Left Eye Distance:   Bilateral Distance:    Right Eye Near:   Left Eye Near:     Bilateral Near:     Physical Exam Vitals and nursing note reviewed.  Constitutional:  Appearance: She is well-developed.  HENT:     Head: Normocephalic.  Pulmonary:     Effort: Pulmonary effort is normal.  Abdominal:     General: There is no distension.  Musculoskeletal:     Cervical back: Normal range of motion.  Skin:    Comments: Tiny scabbed area, I removed with tweezer.  Neurological:     Mental Status: She is alert and oriented to person, place, and time.      UC Treatments / Results  Labs (all labs ordered are listed, but only abnormal results are displayed) Labs Reviewed - No data to display  EKG   Radiology No results found.  Procedures Procedures (including critical care time)  Medications Ordered in UC Medications - No data to display  Initial Impression / Assessment and Plan / UC Course  I have reviewed the triage vital signs and the nursing notes.  Pertinent labs & imaging results that were available during my care of the patient were reviewed by me and considered in my medical decision making (see chart for details).     Patient has a tick in the bag I was able to look at tick and it appears to have head and all parts intact Final Clinical Impressions(s) / UC Diagnoses   Final diagnoses:  Tick bite, unspecified site, initial encounter   Discharge Instructions   None    ED Prescriptions     Medication Sig Dispense Auth. Provider   doxycycline (VIBRAMYCIN) 100 MG capsule Take 1 capsule (100 mg total) by mouth 2 (two) times daily. 20 capsule Elson Areas, [place]      PDMP not reviewed this encounter. An After Visit Summary was printed and given to the patient.    Elson Areas, [place] 11/12/22 1559

## 2023-04-23 ENCOUNTER — Ambulatory Visit
Admission: EM | Admit: 2023-04-23 | Discharge: 2023-04-23 | Disposition: A | Discharge status: Home / Self Care | Payer: 59 | Attending: Emergency Medicine | Admitting: Emergency Medicine

## 2023-04-23 DIAGNOSIS — R1031 Right lower quadrant pain: Secondary | ICD-10-CM | POA: Diagnosis not present

## 2023-04-23 DIAGNOSIS — N898 Other specified noninflammatory disorders of vagina: Secondary | ICD-10-CM | POA: Diagnosis not present

## 2023-04-23 LAB — POCT URINALYSIS DIP (MANUAL ENTRY)
Bilirubin, UA: NEGATIVE
Blood, UA: NEGATIVE
Glucose, UA: NEGATIVE mg/dL
Ketones, POC UA: NEGATIVE mg/dL
Leukocytes, UA: NEGATIVE
Nitrite, UA: NEGATIVE
Protein Ur, POC: NEGATIVE mg/dL
Spec Grav, UA: 1.025 (ref 1.010–1.025)
Urobilinogen, UA: 0.2 E.U./dL
pH, UA: 7 (ref 5.0–8.0)

## 2023-04-23 MED ORDER — METRONIDAZOLE 500 MG PO TABS: 500.0000 mg | ORAL_TABLET | Freq: Two times a day (BID) | ORAL | 0 refills | Status: AC

## 2023-04-23 NOTE — ED Provider Notes (Signed)
[facility]    CSN: [id number] Arrival date & time: 04/23/23  0807      History   Chief Complaint Chief Complaint  Patient presents with   Abdominal Pain    Pt presents with stomach pain that she stated started yesterday    HPI Kristen Lopez is a 62 y.o. female.   Patient presents for evaluation of right lower quadrant abdominal pain beginning 1 day ago, worsening in severity overnight.  Pain interfering with sleep, worsened with urination and when walking and standing.  Pain intermittently radiating to the right lower back.  Associated dysuria and urgency.  Over the past week has been experiencing increase in vaginal discharge with a foul odor.  Has not attempted treatment.  Denies hematuria, urinary frequency, fevers, vaginal itching.  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/25/2019   Seasonal and perennial allergic rhinitis 02/20/2019   Pain in right hand 04/26/2018   Trigger finger 04/26/2018   Strain of knee and leg, left 09/12/2014   Open wound of finger 11/28/2013   Cellulitis and abscess of finger 11/21/2013   Essential hypertension 11/21/2013   Dizziness and giddiness 07/24/2013   Disturbance of skin sensation 07/24/2013   Cervicalgia 07/24/2013   Personal history of diseases of blood and blood-forming organs 07/24/2013    Past Surgical History:  Procedure Laterality Date   CYSTOCELE REPAIR N/A 07/25/2019    Procedure: ANTERIOR REPAIR (CYSTOCELE);  Surgeon: Richarda Overlie, MD;  Location: [facility];  Service: Gynecology;  Laterality: N/A;  need bed   DILITATION & CURRETTAGE/HYSTROSCOPY WITH NOVASURE ABLATION  11/17/2012   Procedure: DILATATION & CURETTAGE/HYSTEROSCOPY WITH NOVASURE ABLATION;  Surgeon: Meriel Pica, MD;  Location: [facility];  Service: Gynecology;  Laterality: N/A;  Attempted and failed   DILITATION & CURRETTAGE/HYSTROSCOPY WITH THERMACHOICE ABLATION  11/17/2012   Procedure: DILATATION & CURETTAGE/HYSTEROSCOPY WITH THERMACHOICE ABLATION;  Surgeon: Meriel Pica, MD;  Location: [facility];  Service: Gynecology;  Laterality: N/A;   EXTERNAL EAR SURGERY     LAPAROSCOPIC ASSISTED VAGINAL HYSTERECTOMY  11/22/2012   Procedure: LAPAROSCOPIC ASSISTED VAGINAL HYSTERECTOMY;  Surgeon: Meriel Pica, MD;  Location: [facility];  Service: Gynecology;  Laterality: N/A;    OB History     Gravida  3   Para  3   Term  3   Preterm      AB      Living  3      SAB      IAB      Ectopic      Multiple      Live Births               Home Medications    Prior to Admission medications   Medication Sig Start Date End Date Taking? Authorizing Provider  Ascorbic Acid (VITAMIN C) 1000 MG tablet Take 1,000 mg by mouth daily.  [provider]  azelastine (ASTELIN) 0.1 % nasal spray Place 2 sprays into both nostrils 2 (two) times daily. Patient not taking: Reported on 10/30/2020 04/13/20   Belinda Fisher, PA-C  cetirizine (ZYRTEC ALLERGY) 10 MG tablet Take 1 tablet (10 mg total) by mouth daily. 04/20/20   Hall-Potvin, [place], PA-C  cholecalciferol (VITAMIN D) 1000 units tablet Take 1,000 Units by mouth daily.     [provider]  doxycycline (VIBRAMYCIN) 100 MG capsule Take 1 capsule (100 mg total) by mouth 2 (two) times daily. 11/06/22   Elson Areas, PA-C  Elderberry 575 MG/5ML SYRP Take by mouth. Patient not taking: Reported on 10/30/2020     [provider]  fluticasone (FLONASE) 50 MCG/ACT nasal spray Place 2 sprays into both nostrils daily. Patient not taking: Reported on 10/30/2020 04/13/20   Belinda Fisher, PA-C  ibuprofen (ADVIL) 200 MG tablet Take 4 tablets (800 mg total) by mouth every 8 (eight) hours as needed for moderate pain. 07/25/19   Richarda Overlie, MD  ivermectin (STROMECTOL) 3 MG TABS tablet SMARTSIG:5 Tablet(s) By Mouth Once Patient not taking: Reported on 10/30/2020 04/21/20   [provider]  MAGNESIUM CITRATE PO Take 1 tablet by mouth daily.    [provider]  Melatonin 5 MG TABS Take 5 mg by mouth at bedtime as needed (sleep).    [provider]  Zinc Sulfate (ZINC 15 PO) Take by mouth.    [provider]    Family History Family History  Problem Relation Age of Onset   Other Father 27       Car accident   Alcohol abuse Mother 59   Diabetes Mother    Hypertension Mother    Arthritis Mother    Heart failure Maternal Grandmother    Hypertension Paternal Grandmother    ALS Paternal Grandfather     Social History Social History   Tobacco Use   Smoking status: Never   Smokeless tobacco: Never  Vaping Use   Vaping Use: Never used  Substance Use Topics   Alcohol use: Yes    Alcohol/week: 1.0 standard drink of alcohol    Types: 1 Glasses of wine per week    Comment: socially   Drug use: No     Allergies   Cefdinir, Amoxicillin, Azithromycin, and Erythromycin   Review of Systems Review of Systems  Genitourinary:  Positive for dysuria and pelvic pain. Negative for decreased urine volume, difficulty urinating, dyspareunia, enuresis, flank pain, frequency, genital sores, hematuria, menstrual problem, urgency, vaginal bleeding, vaginal discharge and vaginal pain.     Physical Exam Triage Vital Signs ED Triage Vitals  Enc Vitals Group     BP 04/23/23 0831 (!) 141/78     Pulse Rate 04/23/23 0831 68     Resp 04/23/23 0831 17     Temp 04/23/23 0831 (!)  97.3 F (36.3 C)     Temp Source 04/23/23 0831 Oral     SpO2 04/23/23 0831 98 %     Weight --      Height --      Head Circumference --      Peak Flow --      Pain Score 04/23/23 0832 4     Pain Loc --      Pain Edu? --      Excl. in GC? --    No data found.  Updated Vital Signs BP (!) 141/78   Pulse 68   Temp (!) 97.3 F (  36.3 C) (Oral)   Resp 17   LMP 08/01/2012   SpO2 98%   Visual Acuity Right Eye Distance:   Left Eye Distance:   Bilateral Distance:    Right Eye Near:   Left Eye Near:    Bilateral Near:     Physical Exam Constitutional:      Appearance: Normal appearance. She is well-developed.  Eyes:     Extraocular Movements: Extraocular movements intact.  Pulmonary:     Effort: Pulmonary effort is normal.  Abdominal:     General: Abdomen is flat. Bowel sounds are normal.     Palpations: Abdomen is soft.     Tenderness: There is abdominal tenderness in the right lower quadrant.  Neurological:     Mental Status: She is alert and oriented to person, place, and time.      UC Treatments / Results  Labs (all labs ordered are listed, but only abnormal results are displayed) Labs Reviewed  POCT URINALYSIS DIP (MANUAL ENTRY)    EKG   Radiology No results found.  Procedures Procedures (including critical care time)  Medications Ordered in UC Medications - No data to display  Initial Impression / Assessment and Plan / UC Course  I have reviewed the triage vital signs and the nursing notes.  Pertinent labs & imaging results that were available during my care of the patient were reviewed by me and considered in my medical decision making (see chart for details).  Right lower quadrant abdominal pain, vaginal discharge  Vital signs are stable patient is in no signs of distress nor toxic ,tenderness is present to the right lower quadrant, low suspicion for more serious organ involvement, urinalysis is negative, prophylactically treating for bacterial  vaginosis based on symptomology, discussed with patient, metronidazole prescribed and advised abstinence from alcohol during medication use, vaginal swab is pending, will treat additional per protocol, advised abstinence until lab results, treatment is complete and all symptoms have resolved, given strict precautions that symptoms worsening severity to go to the nearest emergency department for further evaluation and management Final Clinical Impressions(s) / UC Diagnoses   Final diagnoses:  None   Discharge Instructions   None    ED Prescriptions   None    PDMP not reviewed this encounter.   Valinda Hoar, [place] 04/23/23 [phone]

## 2023-04-23 NOTE — Discharge Instructions (Addendum)
Today you were evaluated for your abdominal pain, based on your symptoms prophylactically treating for bacterial vaginosis  Urinalysis today is negative for infection  Vaginal swab checking for bacterial vaginosis and yeast is pending, you will be notified of positive test results only  Begin metronidazole every morning and every evening for 7 days, avoid alcohol while using this medication as it may cause stomach irritation  Please refrain from any sexual activity until lab results, treatment is complete and all symptoms have resolved  At any point if your abdominal pain worsens in severity please go to the nearest emergency department for further evaluation and possible imaging  In addition: Avoid baths, hot tubs and whirlpool spas.  Don't use scented or harsh soaps Avoid irritants. These include scented tampons and pads. Wipe from front to back after using the toilet. Don't douche. Your vagina doesn't require cleansing other than normal bathing.  Use a condom.  Wear cotton underwear, this fabric absorbs some moisture.

## 2023-04-23 NOTE — ED Triage Notes (Signed)
Pt presents with stomach pain and a possible uti

## 2023-04-25 LAB — CERVICOVAGINAL ANCILLARY ONLY
Comment: NEGATIVE
Comment: NEGATIVE
Comment: NEGATIVE

## 2023-04-27 ENCOUNTER — Telehealth (HOSPITAL_COMMUNITY): Payer: Self-pay | Admitting: Emergency Medicine

## 2023-04-27 ENCOUNTER — Ambulatory Visit
Admission: EM | Admit: 2023-04-27 | Discharge: 2023-04-27 | Disposition: A | Discharge status: Home / Self Care | Payer: 59 | Attending: Emergency Medicine | Admitting: Emergency Medicine

## 2023-04-27 DIAGNOSIS — R1031 Right lower quadrant pain: Secondary | ICD-10-CM | POA: Insufficient documentation

## 2023-04-27 DIAGNOSIS — R109 Unspecified abdominal pain: Secondary | ICD-10-CM | POA: Diagnosis present

## 2023-04-27 DIAGNOSIS — N898 Other specified noninflammatory disorders of vagina: Secondary | ICD-10-CM | POA: Diagnosis not present

## 2023-04-27 NOTE — Telephone Encounter (Signed)
Reviewed with patient that she will need to return for cytology recollect due to insufficient liquid.  She will return before the end of the weekend, and verbalized understanding

## 2023-04-27 NOTE — ED Triage Notes (Signed)
Pt presents to uc today for a reswab for cyto. Nurse visit only.

## 2023-04-28 LAB — CERVICOVAGINAL ANCILLARY ONLY
Bacterial Vaginitis (gardnerella): NEGATIVE
Candida Glabrata: NEGATIVE
Candida Vaginitis: NEGATIVE
Chlamydia: NEGATIVE
Comment: NEGATIVE
Comment: NEGATIVE
Comment: NEGATIVE
Comment: NEGATIVE
Comment: NEGATIVE
Comment: NORMAL
Neisseria Gonorrhea: NEGATIVE
Trichomonas: NEGATIVE

## 2023-05-09 ENCOUNTER — Other Ambulatory Visit: Payer: Self-pay | Admitting: Family Medicine

## 2023-05-09 DIAGNOSIS — R1084 Generalized abdominal pain: Secondary | ICD-10-CM

## 2023-05-10 ENCOUNTER — Ambulatory Visit
Admission: RE | Admit: 2023-05-10 | Discharge: 2023-05-10 | Disposition: A | Discharge status: Home / Self Care | Payer: 59 | Source: Ambulatory Visit | Attending: Family Medicine | Admitting: Family Medicine

## 2023-05-10 DIAGNOSIS — R1084 Generalized abdominal pain: Secondary | ICD-10-CM

## 2023-08-03 ENCOUNTER — Ambulatory Visit: Payer: 59 | Admitting: Podiatry

## 2023-08-12 ENCOUNTER — Ambulatory Visit: Payer: 59

## 2023-08-12 ENCOUNTER — Ambulatory Visit: Payer: 59 | Admitting: Podiatry

## 2023-08-12 ENCOUNTER — Encounter: Payer: Self-pay | Admitting: Podiatry

## 2023-08-12 DIAGNOSIS — M779 Enthesopathy, unspecified: Secondary | ICD-10-CM | POA: Diagnosis not present

## 2023-08-12 NOTE — Progress Notes (Signed)
 Orthotic eval   Patient was seen, measured for custom molded foot orthotics  Patient will benefit from CFO's as they will help provide total contact to MLA's helping to better distribute body weight across BIL feet greater reducing plantar pressure and pain and to also encourage FF and RF alignment  Patient was scanned items to be ordered and fit when in  [employer], CFo, CFm

## 2023-08-15 NOTE — Progress Notes (Signed)
Subjective:   Patient ID: Kristen Lopez, female   DOB: 62 y.o.   MRN: [id number]   HPI Patient presents for prescription for new orthotics at this time stating she is doing well with pain   ROS      Objective:  Physical Exam  Neurovascular status intact discomfort in the plantar feet bilateral that is mild in intensity and controlled very well with orthotic therapy     Assessment:  Inflammatory tendinitis that is controlled at this time     Plan:  H&P reviewed I have recommended long-term new orthotics

## 2023-09-06 ENCOUNTER — Other Ambulatory Visit (HOSPITAL_COMMUNITY): Payer: Self-pay | Admitting: Orthopaedic Surgery

## 2023-09-06 DIAGNOSIS — M25562 Pain in left knee: Secondary | ICD-10-CM

## 2023-09-07 ENCOUNTER — Ambulatory Visit (HOSPITAL_COMMUNITY)
Admission: RE | Admit: 2023-09-07 | Discharge: 2023-09-07 | Disposition: A | Discharge status: Home / Self Care | Payer: 59 | Source: Ambulatory Visit | Attending: Orthopaedic Surgery | Admitting: Orthopaedic Surgery

## 2023-09-07 DIAGNOSIS — M25562 Pain in left knee: Secondary | ICD-10-CM | POA: Insufficient documentation

## 2023-09-15 ENCOUNTER — Other Ambulatory Visit: Payer: 59

## 2024-07-13 ENCOUNTER — Ambulatory Visit: Admitting: Podiatry

## 2024-07-13 DIAGNOSIS — M2042 Other hammer toe(s) (acquired), left foot: Secondary | ICD-10-CM

## 2024-07-13 DIAGNOSIS — M21619 Bunion of unspecified foot: Secondary | ICD-10-CM

## 2024-07-13 NOTE — Patient Instructions (Signed)
 Bunion Surgery: What to Expect  Bunion surgery is done to get rid of a bunion. A bunion is a bump that forms slowly on the inner side of your big toe joint. You may need bunion surgery if your bunion: Is very big. Hurts a lot. Makes it hard for you to walk. Tell a health care provider about: Any allergies you have. All medicines you take. These include vitamins, herbs, eye drops, and creams. Any problems you or family members have had with anesthesia. Any bleeding problems you have. Any surgeries you've had. Any medical conditions you have. Whether you're pregnant or may be pregnant. What are the risks? Your health care provider will talk with you about risks. These may include: Infection. Bleeding or blood clots. Allergic reactions to medicines. Nerve damage. Pain, numbness, stiffness, or arthritis in your toe. The bunion coming back. The bone not healing fully. Surgery not helping with symptoms. What happens before the surgery? When to stop eating and drinking Eat and drink only as you've been told. You may be told this: 8 hours before your surgery Stop eating most foods. Do not eat meat, fried foods, or fatty foods. Eat only light foods, such as toast or crackers. All liquids are OK except energy drinks and alcohol. 6 hours before your surgery Stop eating. Drink only clear liquids, such as water, clear fruit juice, black coffee, plain tea, and sports drinks. Do not drink energy drinks or alcohol. 2 hours before your surgery Stop drinking all liquids. You may be allowed to take medicines with small sips of water. If you do not eat and drink as told, your surgery may be delayed or canceled. Medicines Ask about changing or stopping: Any medicines you take. Any vitamins, herbs, or supplements you take. Do not take aspirin or ibuprofen unless you're told to. Surgery safety For your safety, you may: Need to wash your skin with a soap that kills germs. Get  antibiotics. Have your surgery site marked. General instructions Do not smoke, vape, or use nicotine or tobacco for at least 4 weeks before the surgery. Ask if you'll be staying overnight in the hospital. If you'll be going home right after the surgery, plan to have a responsible adult: Drive you home from the hospital or clinic. You won't be allowed to drive. Stay with you for the time you're told. What happens during the surgery? An IV will be put into a vein in your hand or arm. You may be given: A sedative to help you relax. Anesthesia to keep you from feeling pain. A cut will be made over the bump at the big toe joint. One or more of these procedures may be done: Exostectomy. This is to get rid of the bunion. Osteotomy. This is when the joint in your big toe is cut and put back where it should be. Arthrodesis. This is when screws are used to keep your foot in the right position. A procedure to tighten or loosen the tissues around your big toe. This may be done to move your toe back into the right place. The cut will be closed with stitches or skin glue. It'll be covered with tape strips or a bandage. A boot may be put on your foot to help support it. These steps may vary. Ask what you can expect. What happens after the surgery? You'll be watched closely until you leave. This includes checking your pain level, blood pressure, heart rate, and breathing rate. If you were given a sedative, do  not drive or use machines until you're told it's safe. A sedative can make you sleepy. Ask when it's safe to drive if you have a boot on your foot. Do not stand or walk on your injured foot until you're told it's OK. Use crutches or a walker as told. This information is not intended to replace advice given to you by your health care provider. Make sure you discuss any questions you have with your health care provider. Document Revised: 07/08/2023 Document Reviewed: 07/08/2023 [employer] Patient  Education  2024 [employer].

## 2024-07-15 NOTE — Progress Notes (Signed)
 Subjective:   Patient ID: Kristen Lopez, female   DOB: 64 y.o.   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  We will see the results and decide what needs to be done long-term depending on findings
